# Patient Record
Sex: Female | Born: 1965 | Race: White | Hispanic: No | Marital: Married | Smoking: Former smoker
Health system: Southern US, Community
[De-identification: ages and names within clinical notes are randomized; demographics above are authoritative.]

## PROBLEM LIST (undated history)

## (undated) DIAGNOSIS — F988 Other specified behavioral and emotional disorders with onset usually occurring in childhood and adolescence: Secondary | ICD-10-CM

## (undated) DIAGNOSIS — M2550 Pain in unspecified joint: Secondary | ICD-10-CM

## (undated) DIAGNOSIS — M51379 Other intervertebral disc degeneration, lumbosacral region without mention of lumbar back pain or lower extremity pain: Secondary | ICD-10-CM

## (undated) DIAGNOSIS — G589 Mononeuropathy, unspecified: Secondary | ICD-10-CM

## (undated) DIAGNOSIS — Z973 Presence of spectacles and contact lenses: Secondary | ICD-10-CM

## (undated) DIAGNOSIS — I10 Essential (primary) hypertension: Secondary | ICD-10-CM

## (undated) DIAGNOSIS — K219 Gastro-esophageal reflux disease without esophagitis: Secondary | ICD-10-CM

## (undated) DIAGNOSIS — E8881 Metabolic syndrome: Secondary | ICD-10-CM

## (undated) DIAGNOSIS — E7800 Pure hypercholesterolemia, unspecified: Secondary | ICD-10-CM

## (undated) DIAGNOSIS — R7303 Prediabetes: Secondary | ICD-10-CM

## (undated) DIAGNOSIS — G4730 Sleep apnea, unspecified: Secondary | ICD-10-CM

## (undated) DIAGNOSIS — E88819 Insulin resistance, unspecified: Secondary | ICD-10-CM

## (undated) DIAGNOSIS — M549 Dorsalgia, unspecified: Secondary | ICD-10-CM

## (undated) DIAGNOSIS — E282 Polycystic ovarian syndrome: Secondary | ICD-10-CM

## (undated) DIAGNOSIS — T4145XA Adverse effect of unspecified anesthetic, initial encounter: Secondary | ICD-10-CM

## (undated) DIAGNOSIS — M5137 Other intervertebral disc degeneration, lumbosacral region: Secondary | ICD-10-CM

## (undated) DIAGNOSIS — Z1509 Genetic susceptibility to other malignant neoplasm: Secondary | ICD-10-CM

## (undated) DIAGNOSIS — T8859XA Other complications of anesthesia, initial encounter: Secondary | ICD-10-CM

## (undated) DIAGNOSIS — N979 Female infertility, unspecified: Secondary | ICD-10-CM

## (undated) HISTORY — DX: Sleep apnea, unspecified: G47.30

## (undated) HISTORY — DX: Insulin resistance, unspecified: E88.819

## (undated) HISTORY — DX: Genetic susceptibility to other malignant neoplasm: Z15.09

## (undated) HISTORY — DX: Female infertility, unspecified: N97.9

## (undated) HISTORY — DX: Pain in unspecified joint: M25.50

## (undated) HISTORY — DX: Prediabetes: R73.03

## (undated) HISTORY — PX: TONSILLECTOMY: SUR1361

## (undated) HISTORY — DX: Pure hypercholesterolemia, unspecified: E78.00

## (undated) HISTORY — DX: Polycystic ovarian syndrome: E28.2

## (undated) HISTORY — DX: Metabolic syndrome: E88.81

---

## 1997-10-09 ENCOUNTER — Other Ambulatory Visit: Admission: RE | Admit: 1997-10-09 | Discharge: 1997-10-09 | Payer: Self-pay | Admitting: Gynecology

## 1999-08-30 ENCOUNTER — Other Ambulatory Visit: Admission: RE | Admit: 1999-08-30 | Discharge: 1999-08-30 | Payer: Self-pay | Admitting: Gynecology

## 2000-08-23 ENCOUNTER — Other Ambulatory Visit: Admission: RE | Admit: 2000-08-23 | Discharge: 2000-08-23 | Payer: Self-pay | Admitting: Obstetrics and Gynecology

## 2000-10-23 ENCOUNTER — Encounter: Payer: Self-pay | Admitting: Obstetrics & Gynecology

## 2000-10-23 ENCOUNTER — Ambulatory Visit (HOSPITAL_COMMUNITY): Admission: RE | Admit: 2000-10-23 | Discharge: 2000-10-23 | Payer: Self-pay | Admitting: Obstetrics & Gynecology

## 2000-12-26 ENCOUNTER — Encounter: Admission: RE | Admit: 2000-12-26 | Discharge: 2001-02-07 | Payer: Self-pay | Admitting: Obstetrics & Gynecology

## 2001-03-13 ENCOUNTER — Inpatient Hospital Stay (HOSPITAL_COMMUNITY): Admission: AD | Admit: 2001-03-13 | Discharge: 2001-03-16 | Payer: Self-pay | Admitting: Obstetrics and Gynecology

## 2001-03-17 ENCOUNTER — Encounter: Admission: RE | Admit: 2001-03-17 | Discharge: 2001-04-16 | Payer: Self-pay | Admitting: Obstetrics and Gynecology

## 2001-05-17 ENCOUNTER — Encounter: Admission: RE | Admit: 2001-05-17 | Discharge: 2001-06-16 | Payer: Self-pay | Admitting: Obstetrics and Gynecology

## 2001-07-17 ENCOUNTER — Encounter: Admission: RE | Admit: 2001-07-17 | Discharge: 2001-08-16 | Payer: Self-pay | Admitting: Obstetrics and Gynecology

## 2001-08-17 ENCOUNTER — Encounter: Admission: RE | Admit: 2001-08-17 | Discharge: 2001-09-16 | Payer: Self-pay | Admitting: Obstetrics and Gynecology

## 2001-10-15 ENCOUNTER — Encounter: Admission: RE | Admit: 2001-10-15 | Discharge: 2001-11-14 | Payer: Self-pay | Admitting: Obstetrics and Gynecology

## 2001-10-18 ENCOUNTER — Other Ambulatory Visit: Admission: RE | Admit: 2001-10-18 | Discharge: 2001-10-18 | Payer: Self-pay | Admitting: Obstetrics and Gynecology

## 2002-10-29 ENCOUNTER — Other Ambulatory Visit: Admission: RE | Admit: 2002-10-29 | Discharge: 2002-10-29 | Payer: Self-pay | Admitting: Obstetrics and Gynecology

## 2002-10-31 ENCOUNTER — Encounter: Admission: RE | Admit: 2002-10-31 | Discharge: 2002-10-31 | Payer: Self-pay | Admitting: Obstetrics and Gynecology

## 2002-10-31 ENCOUNTER — Encounter: Payer: Self-pay | Admitting: Obstetrics and Gynecology

## 2003-07-31 ENCOUNTER — Encounter: Admission: RE | Admit: 2003-07-31 | Discharge: 2003-07-31 | Payer: Self-pay | Admitting: Family Medicine

## 2003-12-04 ENCOUNTER — Other Ambulatory Visit: Admission: RE | Admit: 2003-12-04 | Discharge: 2003-12-04 | Payer: Self-pay | Admitting: Obstetrics and Gynecology

## 2004-05-19 ENCOUNTER — Other Ambulatory Visit: Admission: RE | Admit: 2004-05-19 | Discharge: 2004-05-19 | Payer: Self-pay | Admitting: Obstetrics and Gynecology

## 2005-05-16 ENCOUNTER — Other Ambulatory Visit: Admission: RE | Admit: 2005-05-16 | Discharge: 2005-05-16 | Payer: Self-pay | Admitting: Obstetrics and Gynecology

## 2007-06-05 ENCOUNTER — Encounter: Admission: RE | Admit: 2007-06-05 | Discharge: 2007-06-05 | Payer: Self-pay | Admitting: Obstetrics and Gynecology

## 2008-10-22 ENCOUNTER — Encounter: Admission: RE | Admit: 2008-10-22 | Discharge: 2008-10-22 | Payer: Self-pay | Admitting: Obstetrics & Gynecology

## 2008-12-27 ENCOUNTER — Inpatient Hospital Stay (HOSPITAL_COMMUNITY): Admission: AD | Admit: 2008-12-27 | Discharge: 2008-12-29 | Payer: Self-pay | Admitting: Obstetrics & Gynecology

## 2008-12-30 ENCOUNTER — Encounter: Admission: RE | Admit: 2008-12-30 | Discharge: 2009-01-28 | Payer: Self-pay | Admitting: Obstetrics & Gynecology

## 2009-01-29 ENCOUNTER — Encounter: Admission: RE | Admit: 2009-01-29 | Discharge: 2009-02-28 | Payer: Self-pay | Admitting: Obstetrics & Gynecology

## 2009-03-01 ENCOUNTER — Encounter: Admission: RE | Admit: 2009-03-01 | Discharge: 2009-03-31 | Payer: Self-pay | Admitting: Obstetrics & Gynecology

## 2009-04-01 ENCOUNTER — Encounter: Admission: RE | Admit: 2009-04-01 | Discharge: 2009-04-30 | Payer: Self-pay | Admitting: Obstetrics & Gynecology

## 2009-05-01 ENCOUNTER — Encounter: Admission: RE | Admit: 2009-05-01 | Discharge: 2009-05-31 | Payer: Self-pay | Admitting: Obstetrics & Gynecology

## 2009-06-01 ENCOUNTER — Encounter: Admission: RE | Admit: 2009-06-01 | Discharge: 2009-06-30 | Payer: Self-pay | Admitting: Obstetrics & Gynecology

## 2009-07-01 ENCOUNTER — Encounter: Admission: RE | Admit: 2009-07-01 | Discharge: 2009-07-09 | Payer: Self-pay | Admitting: Obstetrics & Gynecology

## 2009-08-01 ENCOUNTER — Encounter: Admission: RE | Admit: 2009-08-01 | Discharge: 2009-08-31 | Payer: Self-pay | Admitting: Obstetrics & Gynecology

## 2009-09-01 ENCOUNTER — Encounter: Admission: RE | Admit: 2009-09-01 | Discharge: 2009-10-01 | Payer: Self-pay | Admitting: Obstetrics & Gynecology

## 2009-12-15 ENCOUNTER — Encounter: Admission: RE | Admit: 2009-12-15 | Discharge: 2009-12-15 | Payer: Self-pay | Admitting: Obstetrics and Gynecology

## 2010-08-01 ENCOUNTER — Encounter: Payer: Self-pay | Admitting: Obstetrics and Gynecology

## 2010-10-18 LAB — CBC
Hemoglobin: 11.5 g/dL — ABNORMAL LOW (ref 12.0–15.0)
Hemoglobin: 12.9 g/dL (ref 12.0–15.0)
MCHC: 35.8 g/dL (ref 30.0–36.0)
MCV: 96.1 fL (ref 78.0–100.0)
RBC: 3.34 MIL/uL — ABNORMAL LOW (ref 3.87–5.11)
RBC: 3.77 MIL/uL — ABNORMAL LOW (ref 3.87–5.11)
RDW: 13.2 % (ref 11.5–15.5)
WBC: 8.3 10*3/uL (ref 4.0–10.5)

## 2010-10-18 LAB — COMPREHENSIVE METABOLIC PANEL
ALT: 17 U/L (ref 0–35)
AST: 28 U/L (ref 0–37)
Alkaline Phosphatase: 115 U/L (ref 39–117)
CO2: 19 mEq/L (ref 19–32)
GFR calc Af Amer: >60 mL/min (ref >60)
GFR calc non Af Amer: >60 mL/min (ref >60)
Glucose, Bld: 106 mg/dL — ABNORMAL HIGH (ref 70–99)
Potassium: 4 mEq/L (ref 3.5–5.1)
Sodium: 136 mEq/L (ref 135–145)

## 2010-10-18 LAB — RPR: RPR Ser Ql: NONREACTIVE

## 2010-12-29 ENCOUNTER — Other Ambulatory Visit: Payer: Self-pay | Admitting: Obstetrics and Gynecology

## 2010-12-29 DIAGNOSIS — Z1231 Encounter for screening mammogram for malignant neoplasm of breast: Secondary | ICD-10-CM

## 2011-01-18 ENCOUNTER — Ambulatory Visit
Admission: RE | Admit: 2011-01-18 | Discharge: 2011-01-18 | Disposition: A | Discharge status: Home / Self Care | Payer: 59 | Source: Ambulatory Visit | Attending: Obstetrics and Gynecology | Admitting: Obstetrics and Gynecology

## 2011-01-18 DIAGNOSIS — Z1231 Encounter for screening mammogram for malignant neoplasm of breast: Secondary | ICD-10-CM

## 2011-12-20 ENCOUNTER — Other Ambulatory Visit: Payer: Self-pay | Admitting: Obstetrics and Gynecology

## 2011-12-20 DIAGNOSIS — Z1231 Encounter for screening mammogram for malignant neoplasm of breast: Secondary | ICD-10-CM

## 2012-01-19 ENCOUNTER — Ambulatory Visit: Payer: 59

## 2012-01-21 ENCOUNTER — Emergency Department (HOSPITAL_BASED_OUTPATIENT_CLINIC_OR_DEPARTMENT_OTHER)
Admission: EM | Admit: 2012-01-21 | Discharge: 2012-01-21 | Disposition: A | Discharge status: Home / Self Care | Payer: 59 | Attending: Emergency Medicine | Admitting: Emergency Medicine

## 2012-01-21 ENCOUNTER — Emergency Department (HOSPITAL_BASED_OUTPATIENT_CLINIC_OR_DEPARTMENT_OTHER): Payer: 59

## 2012-01-21 ENCOUNTER — Encounter (HOSPITAL_BASED_OUTPATIENT_CLINIC_OR_DEPARTMENT_OTHER): Payer: Self-pay | Admitting: *Deleted

## 2012-01-21 DIAGNOSIS — M25579 Pain in unspecified ankle and joints of unspecified foot: Secondary | ICD-10-CM | POA: Insufficient documentation

## 2012-01-21 DIAGNOSIS — S93409A Sprain of unspecified ligament of unspecified ankle, initial encounter: Secondary | ICD-10-CM

## 2012-01-21 DIAGNOSIS — X58XXXA Exposure to other specified factors, initial encounter: Secondary | ICD-10-CM | POA: Insufficient documentation

## 2012-01-21 DIAGNOSIS — Z79899 Other long term (current) drug therapy: Secondary | ICD-10-CM | POA: Insufficient documentation

## 2012-01-21 DIAGNOSIS — I10 Essential (primary) hypertension: Secondary | ICD-10-CM | POA: Insufficient documentation

## 2012-01-21 DIAGNOSIS — M25476 Effusion, unspecified foot: Secondary | ICD-10-CM | POA: Insufficient documentation

## 2012-01-21 DIAGNOSIS — M25473 Effusion, unspecified ankle: Secondary | ICD-10-CM | POA: Insufficient documentation

## 2012-01-21 HISTORY — DX: Essential (primary) hypertension: I10

## 2012-01-21 NOTE — ED Notes (Addendum)
Pt states she injured her right ankle about an hour ago. Ice applied at triage. Pt in W/C.

## 2012-01-21 NOTE — ED Provider Notes (Signed)
Medical screening examination/treatment/procedure(s) were performed by non-physician practitioner and as supervising physician I was immediately available for consultation/collaboration.  Gerhard Munch, MD 01/21/12 2249

## 2012-01-21 NOTE — ED Provider Notes (Signed)
History     CSN: [id number]  Arrival date & time 01/21/12  1624   First MD Initiated Contact with Patient 01/21/12 1818      Chief Complaint  Patient presents with  . Ankle Injury    (Consider location/radiation/quality/duration/timing/severity/associated sxs/prior treatment) Patient is a 46 y.o. female presenting with lower extremity injury. The history is provided by the patient.  Ankle Injury This is a new problem. The problem occurs constantly. The problem has been gradually worsening. Associated symptoms include arthralgias. Pertinent negatives include no abdominal pain, chest pain, coughing, nausea, neck pain or vomiting. The symptoms are aggravated by standing and walking. She has tried NSAIDs for the symptoms. The treatment provided mild relief.    Past Medical History  Diagnosis Date  . Hypertension     History reviewed. No pertinent past surgical history.  No family history on file.  History  Substance Use Topics  . Smoking status: Never Smoker   . Smokeless tobacco: Not on file  . Alcohol Use: Yes    OB History    Grav Para Term Preterm Abortions TAB SAB Ect Mult Living                  Review of Systems  Constitutional: Negative for activity change.       All ROS Neg except as noted in HPI  HENT: Negative for nosebleeds and neck pain.   Eyes: Negative for photophobia and discharge.  Respiratory: Negative for cough, shortness of breath and wheezing.   Cardiovascular: Negative for chest pain and palpitations.  Gastrointestinal: Negative for nausea, vomiting, abdominal pain and blood in stool.  Genitourinary: Negative for dysuria, frequency and hematuria.  Musculoskeletal: Positive for arthralgias. Negative for back pain.  Skin: Negative.   Neurological: Negative for dizziness, seizures and speech difficulty.  Psychiatric/Behavioral: Negative for hallucinations and confusion.    Allergies  Review of patient's allergies indicates no known  allergies.  Home Medications   Current Outpatient Rx  Name Route Sig Dispense Refill  . DROSPIRENONE-ETHINYL ESTRADIOL 3-0.02 MG PO TABS Oral Take 1 tablet by mouth daily.    . METHYLDOPA PO Oral Take 1 tablet by mouth daily.    [name] Kitchen NAPROXEN SODIUM 220 MG PO TABS Oral Take 440 mg by mouth daily as needed. For pain    . NIFEDIPINE PO Oral Take 1 tablet by mouth daily.      BP 128/80  Pulse 76  Temp 98.5 F (36.9 C) (Oral)  Resp 20  Ht 5\' 4"  (1.626 m)  Wt 190 lb (86.183 kg)  BMI 32.61 kg/m2  SpO2 100%  LMP 12/31/2011  Physical Exam  Nursing note and vitals reviewed. Constitutional: She is oriented to person, place, and time. She appears well-developed and well-nourished.  Non-toxic appearance.  HENT:  Head: Normocephalic.  Right Ear: Tympanic membrane and external ear normal.  Left Ear: Tympanic membrane and external ear normal.  Eyes: EOM and lids are normal. Pupils are equal, round, and reactive to light.  Neck: Normal range of motion. Neck supple. Carotid bruit is not present.  Cardiovascular: Normal rate, regular rhythm, normal heart sounds, intact distal pulses and normal pulses.   Pulmonary/Chest: Breath sounds normal. No respiratory distress.  Abdominal: Soft. Bowel sounds are normal. There is no tenderness. There is no guarding.  Musculoskeletal: Normal range of motion.       There is swelling and tenderness of the lateral malleolus on the right. There is mild soreness of the medial malleolus. There is good  capillary refill. Pulses are symmetrical. The Achilles tendon on the right is intact. There is full range of motion of the knees and hips.  Lymphadenopathy:       Head (right side): No submandibular adenopathy present.       Head (left side): No submandibular adenopathy present.    She has no cervical adenopathy.  Neurological: She is alert and oriented to person, place, and time. She has normal strength. No cranial nerve deficit or sensory deficit.  Skin: Skin is  warm and dry.  Psychiatric: She has a normal mood and affect. Her speech is normal.    ED Course  Procedures (including critical care time)  Labs Reviewed - No data to display Dg Ankle Complete Right  01/21/2012  *RADIOLOGY REPORT*  Clinical Data: Ankle injury and pain.  RIGHT ANKLE - COMPLETE 3+ VIEW  Comparison: None.  Findings: No evidence of fracture or dislocation.  No evidence of ankle joint effusion. An accessory ossicle or old avulsion fracture fragment is seen along the inferior aspect of the medial malleolus. No evidence of ankle joint arthropathy or other significant bone abnormality.  IMPRESSION: No acute findings.  Original Report Authenticated By: Danae Orleans, M.D.     1. Ankle sprain       MDM  I have reviewed nursing notes, vital signs, and all appropriate lab and imaging results for this patient. Patient states that she changed shoes and while walking she twisted the right ankle. She did not sustain a fall. The patient noted swelling and pain and presented to the emergency department for evaluation of fracture. The x-ray of the right ankle is negative for fracture or dislocation. The patient is fitted with an ankle stirrup splint. Crutches were offered but the patient declined. The patient requests to use naproxen for her discomfort. She is advised to apply ice and to elevate the ankle. She is given orthopedic referral for evaluation if not improving. It is safe for the patient to go home at this time.       Kathie Dike, [place] 01/21/12 1906

## 2012-02-28 ENCOUNTER — Ambulatory Visit
Admission: RE | Admit: 2012-02-28 | Discharge: 2012-02-28 | Disposition: A | Discharge status: Home / Self Care | Payer: 59 | Source: Ambulatory Visit | Attending: Obstetrics and Gynecology | Admitting: Obstetrics and Gynecology

## 2012-02-28 DIAGNOSIS — Z1231 Encounter for screening mammogram for malignant neoplasm of breast: Secondary | ICD-10-CM

## 2013-02-15 ENCOUNTER — Other Ambulatory Visit: Payer: Self-pay

## 2013-02-15 DIAGNOSIS — Z1231 Encounter for screening mammogram for malignant neoplasm of breast: Secondary | ICD-10-CM

## 2013-03-01 ENCOUNTER — Ambulatory Visit
Admission: RE | Admit: 2013-03-01 | Discharge: 2013-03-01 | Disposition: A | Discharge status: Home / Self Care | Payer: 59 | Source: Ambulatory Visit

## 2013-03-01 DIAGNOSIS — Z1231 Encounter for screening mammogram for malignant neoplasm of breast: Secondary | ICD-10-CM

## 2014-02-28 ENCOUNTER — Other Ambulatory Visit: Payer: Self-pay

## 2014-02-28 DIAGNOSIS — Z1231 Encounter for screening mammogram for malignant neoplasm of breast: Secondary | ICD-10-CM

## 2014-03-07 ENCOUNTER — Ambulatory Visit: Payer: 59

## 2014-03-14 ENCOUNTER — Ambulatory Visit
Admission: RE | Admit: 2014-03-14 | Discharge: 2014-03-14 | Disposition: A | Discharge status: Home / Self Care | Payer: 59 | Source: Ambulatory Visit

## 2014-03-14 DIAGNOSIS — Z1231 Encounter for screening mammogram for malignant neoplasm of breast: Secondary | ICD-10-CM

## 2014-07-31 ENCOUNTER — Other Ambulatory Visit: Payer: Self-pay | Admitting: Orthopedic Surgery

## 2014-08-12 ENCOUNTER — Encounter (HOSPITAL_BASED_OUTPATIENT_CLINIC_OR_DEPARTMENT_OTHER): Payer: Self-pay | Admitting: *Deleted

## 2014-08-12 NOTE — Progress Notes (Signed)
To come in for ekg-bmet 

## 2014-08-13 ENCOUNTER — Ambulatory Visit (HOSPITAL_BASED_OUTPATIENT_CLINIC_OR_DEPARTMENT_OTHER)
Admission: RE | Admit: 2014-08-13 | Discharge: 2014-08-13 | Disposition: A | Discharge status: Home / Self Care | Payer: 59 | Source: Ambulatory Visit | Attending: Orthopedic Surgery | Admitting: Orthopedic Surgery

## 2014-08-13 DIAGNOSIS — Z87891 Personal history of nicotine dependence: Secondary | ICD-10-CM | POA: Diagnosis not present

## 2014-08-13 DIAGNOSIS — I10 Essential (primary) hypertension: Secondary | ICD-10-CM | POA: Diagnosis not present

## 2014-08-13 DIAGNOSIS — Z01810 Encounter for preprocedural cardiovascular examination: Secondary | ICD-10-CM | POA: Insufficient documentation

## 2014-08-13 DIAGNOSIS — G5600 Carpal tunnel syndrome, unspecified upper limb: Secondary | ICD-10-CM | POA: Diagnosis not present

## 2014-08-13 DIAGNOSIS — R000 Tachycardia, unspecified: Secondary | ICD-10-CM | POA: Diagnosis not present

## 2014-08-13 DIAGNOSIS — G5602 Carpal tunnel syndrome, left upper limb: Secondary | ICD-10-CM | POA: Diagnosis not present

## 2014-08-13 DIAGNOSIS — Z79899 Other long term (current) drug therapy: Secondary | ICD-10-CM | POA: Diagnosis not present

## 2014-08-13 LAB — BASIC METABOLIC PANEL
Anion gap: 9 (ref 5–15)
BUN: 16 mg/dL (ref 6–23)
CALCIUM: 9.2 mg/dL (ref 8.4–10.5)
CO2: 24 mmol/L (ref 19–32)
Chloride: 104 mmol/L (ref 96–112)
Creatinine, Ser: 0.74 mg/dL (ref 0.50–1.10)
GFR calc Af Amer: >90 mL/min (ref >90)
GLUCOSE: 107 mg/dL — AB (ref 70–99)
Potassium: 4.3 mmol/L (ref 3.5–5.1)
SODIUM: 137 mmol/L (ref 135–145)

## 2014-08-14 ENCOUNTER — Encounter (HOSPITAL_BASED_OUTPATIENT_CLINIC_OR_DEPARTMENT_OTHER): Payer: Self-pay | Admitting: Orthopedic Surgery

## 2014-08-14 ENCOUNTER — Ambulatory Visit (HOSPITAL_BASED_OUTPATIENT_CLINIC_OR_DEPARTMENT_OTHER): Payer: 59 | Admitting: Certified Registered"

## 2014-08-14 ENCOUNTER — Encounter (HOSPITAL_BASED_OUTPATIENT_CLINIC_OR_DEPARTMENT_OTHER)
Admission: RE | Disposition: A | Discharge status: Home / Self Care | Payer: Self-pay | Source: Ambulatory Visit | Attending: Orthopedic Surgery

## 2014-08-14 ENCOUNTER — Ambulatory Visit (HOSPITAL_BASED_OUTPATIENT_CLINIC_OR_DEPARTMENT_OTHER)
Admission: RE | Admit: 2014-08-14 | Discharge: 2014-08-14 | Disposition: A | Discharge status: Home / Self Care | Payer: 59 | Source: Ambulatory Visit | Attending: Orthopedic Surgery | Admitting: Orthopedic Surgery

## 2014-08-14 DIAGNOSIS — Z87891 Personal history of nicotine dependence: Secondary | ICD-10-CM | POA: Insufficient documentation

## 2014-08-14 DIAGNOSIS — G5602 Carpal tunnel syndrome, left upper limb: Secondary | ICD-10-CM | POA: Insufficient documentation

## 2014-08-14 DIAGNOSIS — R000 Tachycardia, unspecified: Secondary | ICD-10-CM | POA: Insufficient documentation

## 2014-08-14 DIAGNOSIS — Z79899 Other long term (current) drug therapy: Secondary | ICD-10-CM | POA: Insufficient documentation

## 2014-08-14 DIAGNOSIS — I10 Essential (primary) hypertension: Secondary | ICD-10-CM | POA: Insufficient documentation

## 2014-08-14 HISTORY — DX: Other complications of anesthesia, initial encounter: T88.59XA

## 2014-08-14 HISTORY — PX: CARPAL TUNNEL RELEASE: SHX101

## 2014-08-14 HISTORY — DX: Presence of spectacles and contact lenses: Z97.3

## 2014-08-14 HISTORY — DX: Adverse effect of unspecified anesthetic, initial encounter: T41.45XA

## 2014-08-14 HISTORY — DX: Other specified behavioral and emotional disorders with onset usually occurring in childhood and adolescence: F98.8

## 2014-08-14 LAB — POCT HEMOGLOBIN-HEMACUE: Hemoglobin: 14.7 g/dL (ref 12.0–15.0)

## 2014-08-14 SURGERY — CARPAL TUNNEL RELEASE
Anesthesia: Monitor Anesthesia Care | Site: Wrist | Laterality: Left

## 2014-08-14 MED ORDER — OXYCODONE HCL 5 MG PO TABS
ORAL_TABLET | ORAL | Status: AC
  Filled 2014-08-14: qty 1

## 2014-08-14 MED ORDER — MIDAZOLAM HCL 2 MG/2ML IJ SOLN
INTRAMUSCULAR | Status: AC
  Filled 2014-08-14: qty 2

## 2014-08-14 MED ORDER — CEFAZOLIN SODIUM-DEXTROSE 2-3 GM-% IV SOLR: 2.0000 g | INTRAVENOUS | Status: DC

## 2014-08-14 MED ORDER — OXYCODONE HCL 5 MG PO TABS
5.0000 mg | ORAL_TABLET | Freq: Once | ORAL | Status: AC | PRN
  Administered 2014-08-14: 5 mg via ORAL

## 2014-08-14 MED ORDER — LIDOCAINE HCL (PF) 0.5 % IJ SOLN
INTRAMUSCULAR | Status: DC | PRN
  Administered 2014-08-14: 30 mL via INTRAVENOUS

## 2014-08-14 MED ORDER — BUPIVACAINE HCL (PF) 0.25 % IJ SOLN
INTRAMUSCULAR | Status: DC | PRN
  Administered 2014-08-14: 8 mL

## 2014-08-14 MED ORDER — CHLORHEXIDINE GLUCONATE 4 % EX LIQD
60.0000 mL | Freq: Once | CUTANEOUS | Status: DC
Start: 2014-08-14 — End: 2014-08-14

## 2014-08-14 MED ORDER — LIDOCAINE HCL (CARDIAC) 20 MG/ML IV SOLN
INTRAVENOUS | Status: DC | PRN
  Administered 2014-08-14: 30 mg via INTRAVENOUS

## 2014-08-14 MED ORDER — MIDAZOLAM HCL 2 MG/2ML IJ SOLN: 1.0000 mg | INTRAMUSCULAR | Status: DC | PRN

## 2014-08-14 MED ORDER — FENTANYL CITRATE 0.05 MG/ML IJ SOLN: 50.0000 ug | INTRAMUSCULAR | Status: DC | PRN

## 2014-08-14 MED ORDER — OXYCODONE HCL 5 MG/5ML PO SOLN: 5.0000 mg | Freq: Once | ORAL | Status: AC | PRN

## 2014-08-14 MED ORDER — LACTATED RINGERS IV SOLN
INTRAVENOUS | Status: DC
  Administered 2014-08-14: 11:00:00 via INTRAVENOUS

## 2014-08-14 MED ORDER — ONDANSETRON HCL 4 MG/2ML IJ SOLN
INTRAMUSCULAR | Status: DC | PRN
  Administered 2014-08-14: 4 mg via INTRAVENOUS

## 2014-08-14 MED ORDER — HYDROMORPHONE HCL 1 MG/ML IJ SOLN: 0.2500 mg | INTRAMUSCULAR | Status: DC | PRN

## 2014-08-14 MED ORDER — MIDAZOLAM HCL 5 MG/5ML IJ SOLN
INTRAMUSCULAR | Status: DC | PRN
  Administered 2014-08-14: 2 mg via INTRAVENOUS

## 2014-08-14 MED ORDER — FENTANYL CITRATE 0.05 MG/ML IJ SOLN
INTRAMUSCULAR | Status: DC | PRN
  Administered 2014-08-14: 50 ug via INTRAVENOUS
  Administered 2014-08-14 (×2): 25 ug via INTRAVENOUS

## 2014-08-14 MED ORDER — CHLORHEXIDINE GLUCONATE 4 % EX LIQD: 60.0000 mL | Freq: Once | CUTANEOUS | Status: DC

## 2014-08-14 MED ORDER — FENTANYL CITRATE 0.05 MG/ML IJ SOLN
INTRAMUSCULAR | Status: AC
  Filled 2014-08-14: qty 2

## 2014-08-14 MED ORDER — PROPOFOL INFUSION 10 MG/ML OPTIME
INTRAVENOUS | Status: DC | PRN
  Administered 2014-08-14: 75 ug/kg/min via INTRAVENOUS

## 2014-08-14 MED ORDER — HYDROCODONE-ACETAMINOPHEN 5-325 MG PO TABS: 1.0000 | ORAL_TABLET | Freq: Four times a day (QID) | ORAL | Status: DC | PRN

## 2014-08-14 MED ORDER — ONDANSETRON HCL 4 MG/2ML IJ SOLN: 4.0000 mg | Freq: Once | INTRAMUSCULAR | Status: DC | PRN

## 2014-08-14 MED ORDER — CEFAZOLIN SODIUM-DEXTROSE 2-3 GM-% IV SOLR
2.0000 g | INTRAVENOUS | Status: AC
  Administered 2014-08-14: 2 g via INTRAVENOUS

## 2014-08-14 SURGICAL SUPPLY — 35 items
BLADE SURG 15 STRL LF DISP TIS (BLADE) ×1 IMPLANT
BLADE SURG 15 STRL SS (BLADE) ×1
BNDG COHESIVE 3X5 TAN STRL LF (GAUZE/BANDAGES/DRESSINGS) ×2 IMPLANT
BNDG ESMARK 4X9 LF (GAUZE/BANDAGES/DRESSINGS) IMPLANT
BNDG GAUZE ELAST 4 BULKY (GAUZE/BANDAGES/DRESSINGS) ×2 IMPLANT
CHLORAPREP W/TINT 26ML (MISCELLANEOUS) ×2 IMPLANT
CORDS BIPOLAR (ELECTRODE) ×2 IMPLANT
COVER BACK TABLE 60X90IN (DRAPES) ×2 IMPLANT
COVER MAYO STAND STRL (DRAPES) ×2 IMPLANT
CUFF TOURNIQUET SINGLE 18IN (TOURNIQUET CUFF) ×2 IMPLANT
DRAPE EXTREMITY T 121X128X90 (DRAPE) ×2 IMPLANT
DRAPE SURG 17X23 STRL (DRAPES) ×2 IMPLANT
DRSG PAD ABDOMINAL 8X10 ST (GAUZE/BANDAGES/DRESSINGS) ×2 IMPLANT
GAUZE SPONGE 4X4 12PLY STRL (GAUZE/BANDAGES/DRESSINGS) ×2 IMPLANT
GAUZE XEROFORM 1X8 LF (GAUZE/BANDAGES/DRESSINGS) ×2 IMPLANT
GLOVE BIOGEL PI IND STRL 7.0 (GLOVE) ×1 IMPLANT
GLOVE BIOGEL PI IND STRL 8.5 (GLOVE) ×1 IMPLANT
GLOVE BIOGEL PI INDICATOR 7.0 (GLOVE) ×1
GLOVE BIOGEL PI INDICATOR 8.5 (GLOVE) ×1
GLOVE ECLIPSE 6.5 STRL STRAW (GLOVE) ×2 IMPLANT
GLOVE EXAM NITRILE MD LF STRL (GLOVE) ×2 IMPLANT
GLOVE SURG ORTHO 8.0 STRL STRW (GLOVE) ×2 IMPLANT
GOWN STRL REUS W/ TWL LRG LVL3 (GOWN DISPOSABLE) ×1 IMPLANT
GOWN STRL REUS W/TWL LRG LVL3 (GOWN DISPOSABLE) ×1
GOWN STRL REUS W/TWL XL LVL3 (GOWN DISPOSABLE) ×2 IMPLANT
NEEDLE PRECISIONGLIDE 27X1.5 (NEEDLE) ×2 IMPLANT
NS IRRIG 1000ML POUR BTL (IV SOLUTION) ×2 IMPLANT
PACK BASIN DAY SURGERY FS (CUSTOM PROCEDURE TRAY) ×2 IMPLANT
STOCKINETTE 4X48 STRL (DRAPES) ×2 IMPLANT
SUT ETHILON 4 0 PS 2 18 (SUTURE) ×2 IMPLANT
SUT VICRYL 4-0 PS2 18IN ABS (SUTURE) IMPLANT
SYR BULB 3OZ (MISCELLANEOUS) ×2 IMPLANT
SYR CONTROL 10ML LL (SYRINGE) ×2 IMPLANT
TOWEL OR 17X24 6PK STRL BLUE (TOWEL DISPOSABLE) ×2 IMPLANT
UNDERPAD 30X30 INCONTINENT (UNDERPADS AND DIAPERS) ×2 IMPLANT

## 2014-08-14 NOTE — Op Note (Signed)
Dictation Number [id number]

## 2014-08-14 NOTE — Op Note (Signed)
[name], Gina Norman                ACCOUNT NO.:  [id number]  MEDICAL RECORD NO.:  [id number]  LOCATION:                                 FACILITY:  PHYSICIAN:  Cindee SaltGary Brannen Koppen, M.D.            DATE OF BIRTH:  DATE OF PROCEDURE:  08/14/2014 DATE OF DISCHARGE:                              OPERATIVE REPORT   PREOPERATIVE NOTE:  Carpal tunnel syndrome, left hand.  POSTOPERATIVE DIAGNOSIS:  Carpal tunnel syndrome, left hand.  OPERATION:  Decompression left median nerve.  SURGEON:  Cindee SaltGary Meshelle Holness, M.D.  ANESTHESIA:  Forearm IV regional with local infiltration.  ANESTHESIOLOGIST:  Sheldon Silvanavid Crews, M.D.  HISTORY:  The patient is a 49 year old female with a history of carpal tunnel syndrome, nerve conduction is positive.  She is admitted now for decompression to the left median nerve at the wrist.  Pre, peri, and postoperative course have been discussed along with risks and complications.  She is aware that there is no guarantee with the surgery; possibility of infection; recurrence of injury to arteries, nerves, tendons; incomplete relief of symptoms; dystrophy.  In the preoperative area, the patient was seen, the extremity marked by both patient and surgeon, antibiotic given.  PROCEDURE IN DETAIL:  The patient was brought to the operating room where forearm-based IV regional anesthetic was carried out without difficulty under the direction of Dr. Ivin Bootyrews.  She was prepped using ChloraPrep, supine position, left arm free.  A 3-minute dry time was allowed.  Time-out taken, confirming the patient and procedure.  A longitudinal incision was made in the left palm, carried down through subcutaneous tissue.  She had some feeling, the area was then injected with local infiltration of 0.25% bupivacaine without epinephrine, approximately 7 mL was used.  The palmar fascia was split.  Superficial palmar arch identified.  The flexor tendon to the ring and little finger identified to the ulnar side of median  nerve and carpal retinaculum was incised with sharp dissection.  Right angle and Sewall retractor were placed between skin and forearm fascia.  The fascia was released for approximately 2 cm proximal to the wrist crease under direct vision. Canal was explored.  Air compression to the nerve was apparent.  Motor branch entered muscle distally.  The wound was copiously irrigated with saline and the skin closed with interrupted 4-0 nylon sutures.  A sterile compressive dressing with the fingers free was applied. On deflation of the tourniquet, all fingers immediately pinked.  She was taken to the recovery room for observation in satisfactory condition. She will be discharged home [facility], on [facility].          ______________________________ Cindee SaltGary Ariatna Jester, M.D.     GK/MEDQ  D:  08/14/2014  T:  08/14/2014  Job:  [id number]

## 2014-08-14 NOTE — Anesthesia Procedure Notes (Addendum)
Procedure Name: MAC Date/Time: 08/14/2014 12:04 PM Performed by: Calin Ellery Pre-anesthesia Checklist: Patient identified, Emergency Drugs available, Suction available and Patient being monitored Patient Re-evaluated:Patient Re-evaluated prior to inductionOxygen Delivery Method: Simple face mask   Anesthesia Regional Block:  Bier block (IV Regional)  Pre-Anesthetic Checklist: ,, timeout performed, Correct Patient, Correct Site, Correct Laterality, Correct Procedure,, site marked, surgical consent,, at surgeon's request Needles:  Injection technique: Single-shot  Needle Type: Other      Needle Gauge: 20 and 20 G    Additional Needles: Bier block (IV Regional) Narrative:   Performed by: Personally

## 2014-08-14 NOTE — Anesthesia Preprocedure Evaluation (Addendum)
Anesthesia Evaluation  Patient identified by MRN, date of birth, ID band Patient awake    Reviewed: Allergy & Precautions, NPO status , Patient's Chart, lab work & pertinent test results  Airway Mallampati: I  TM Distance: >3 FB Neck ROM: Full    Dental  (+) Teeth Intact, Dental Advisory Given   Pulmonary former smoker,  breath sounds clear to auscultation        Cardiovascular hypertension, Pt. on medications Rhythm:Regular Rate:Normal     Neuro/Psych    GI/Hepatic   Endo/Other    Renal/GU      Musculoskeletal   Abdominal   Peds  Hematology   Anesthesia Other Findings   Reproductive/Obstetrics                            Anesthesia Physical Anesthesia Plan  ASA: II  Anesthesia Plan: MAC and Bier Block   Post-op Pain Management:    Induction: Intravenous  Airway Management Planned: Simple Face Mask  Additional Equipment:   Intra-op Plan:   Post-operative Plan:   Informed Consent: I have reviewed the patients History and Physical, chart, labs and discussed the procedure including the risks, benefits and alternatives for the proposed anesthesia with the patient or authorized representative who has indicated his/her understanding and acceptance.   Dental advisory given  Plan Discussed with: CRNA, Anesthesiologist and Surgeon  Anesthesia Plan Comments:         Anesthesia Quick Evaluation

## 2014-08-14 NOTE — Transfer of Care (Signed)
Immediate Anesthesia Transfer of Care Note  Patient: Gina BeaversSherri L Cegielski  Procedure(s) Performed: Procedure(s) with comments: LEFT CARPAL TUNNEL RELEASE (Left) - IV REGIONAL FAB  Patient Location: PACU  Anesthesia Type:MAC and Bier block  Level of Consciousness: awake and alert   Airway & Oxygen Therapy: Patient Spontanous Breathing and Patient connected to face mask oxygen  Post-op Assessment: Report given to RN and Post -op Vital signs reviewed and stable  Post vital signs: Reviewed and stable  Last Vitals:  Filed Vitals:   08/14/14 1019  BP: 135/87  Pulse: 109  Temp: 36.8 C  Resp: 20    Complications: No apparent anesthesia complications

## 2014-08-14 NOTE — Brief Op Note (Signed)
08/14/2014  12:26 PM  PATIENT:  Gina Norman  49 y.o. female  PRE-OPERATIVE DIAGNOSIS:  LEFT CARPAL TUNNEL SYNDROME  POST-OPERATIVE DIAGNOSIS:  LEFT CARPAL TUNNEL SYNDROME  PROCEDURE:  Procedure(s) with comments: LEFT CARPAL TUNNEL RELEASE (Left) - IV REGIONAL FAB  SURGEON:  Surgeon(s) and Role:    * Cindee SaltGary Merita Hawks, MD - Primary  PHYSICIAN ASSISTANT:   ASSISTANTS: none   ANESTHESIA:   local and regional  EBL:  Total I/O In: 600 [I.V.:600] Out: -   BLOOD ADMINISTERED:none  DRAINS: none   LOCAL MEDICATIONS USED:  BUPIVICAINE   SPECIMEN:  No Specimen  DISPOSITION OF SPECIMEN:  N/A  COUNTS:  YES  TOURNIQUET:   Total Tourniquet Time Documented: Forearm (Left) - 17 minutes Total: Forearm (Left) - 17 minutes   DICTATION: .Other Dictation: Dictation Number [id number]  PLAN OF CARE: Discharge to home after PACU  PATIENT DISPOSITION:  PACU - hemodynamically stable.

## 2014-08-14 NOTE — Discharge Instructions (Addendum)

## 2014-08-14 NOTE — H&P (Signed)
Gina Norman is a 49 year old left hand dominant female referred by Dr. Donnie Coffin for a consultation with respect to bilateral numbness and tingling on her left side on a constant basis and on her right side less frequently. This awakens her 3 out of 7 nights. She has no history of injury to the hand or neck. Her left side has been going on for approximately 8 years. She has been wearing a brace and taking ibuprofen which has not given her any relief. States rest has helped but does not always allow her to sleep at night. She has no history of diabetes or thyroid problems. She does have a history of arthritis. She has no history of gout. There is a family history of diabetes and she has been tested. She had symptoms while she was pregnant. She is complaining of some discomfort primarily on her right side, numbness and tingling on her left side is constant. She complains of intermittent, moderate, sharp, aching type pain with a feeling of weakness. She states it is getting worse. Activity makes it worse and rest makes it better. She has not had nerve conductions done at the present time. She has recently graduated from school in Conservation officer, nature.   PAST MEDICAL HISTORY:  She is allergic to E-Mycin. She is on Nifedipen, Lisinopril, she has had birth control, Singulair, Zyrtec, and probiotics. She has had a tonsillectomy.  FAMILY MEDICAL HISTORY: Positive for diabetes, high BP and arthritis.  SOCIAL HISTORY:  She does not smoke. She drinks socially. She is married.  REVIEW OF SYSTEMS: Positive for glasses, contacts, high BP, otherwise negative 14 points.  Gina Norman is an 49 y.o. female.   Chief Complaint: CTS left HPI: see above  Past Medical History  Diagnosis Date  . Hypertension   . Wears contact lenses   . ADD (attention deficit disorder)   . Complication of anesthesia     hard to wake up    Past Surgical History  Procedure Laterality Date  . Tonsillectomy      History reviewed. No  pertinent family history. Social History:  reports that she quit smoking about 6 years ago. She does not have any smokeless tobacco history on file. She reports that she drinks alcohol. She reports that she does not use illicit drugs.  Allergies:  Allergies  Allergen Reactions  . Erythromycin Nausea Only    No prescriptions prior to admission    Results for orders placed or performed during the hospital encounter of 08/14/14 (from the past 48 hour(s))  Basic metabolic panel     Status: Abnormal   Collection Time: 08/13/14 10:00 AM  Result Value Ref Range   Sodium 137 135 - 145 mmol/L   Potassium 4.3 3.5 - 5.1 mmol/L   Chloride 104 96 - 112 mmol/L   CO2 24 19 - 32 mmol/L   Glucose, Bld 107 (H) 70 - 99 mg/dL   BUN 16 6 - 23 mg/dL   Creatinine, Ser 0.74 0.50 - 1.10 mg/dL   Calcium 9.2 8.4 - 10.5 mg/dL   GFR calc non Af Amer >90 >90 mL/min   GFR calc Af Amer >90 >90 mL/min    Comment: (NOTE) The eGFR has been calculated using the CKD EPI equation. This calculation has not been validated in all clinical situations. eGFR's persistently <90 mL/min signify possible Chronic Kidney Disease.    Anion gap 9 5 - 15    No results found.   Pertinent items are noted in HPI.  Height _0  (1.626 m), weight 90.719 kg (200 lb), last menstrual period 07/30/2014.  General appearance: alert, cooperative and appears stated age Head: Normocephalic, without obvious abnormality Neck: no JVD Resp: clear to auscultation bilaterally Cardio: regular rate and rhythm, S1, S2 normal, no murmur, click, rub or gallop GI: soft, non-tender; bowel sounds normal; no masses,  no organomegaly Extremities: numbness left hand Pulses: 2+ and symmetric Skin: Skin color, texture, turgor normal. No rashes or lesions Neurologic: Grossly normal Incision/Wound: na  Assessment/Plan Nerve conductions are done revealing a motor delay of 9.3 on the left, 5.1 on the right, sensory delay of 4.3 on the left and 2.9  on the right. She has an amplitude diminution to 9 on her left and 29 on her right.  DIAGNOSIS: Bilateral carpal tunnel syndrome  She would like to proceed. Questions are encouraged and answered to the patient's satisfaction. We have discussed doing this bilaterally and she does not want to have bilateral done. She is scheduled for left carpal tunnel release as an outpatient under regional anesthesia at her discretion.  Shireen Rayburn R 08/14/2014, 9:07 AM

## 2014-08-14 NOTE — Anesthesia Postprocedure Evaluation (Signed)
  Anesthesia Post-op Note  Patient: Gina Norman  Procedure(s) Performed: Procedure(s) with comments: LEFT CARPAL TUNNEL RELEASE (Left) - IV REGIONAL FAB  Patient Location: PACU  Anesthesia Type: MAC, Bier Block   Level of Consciousness: awake, alert  and oriented  Airway and Oxygen Therapy: Patient Spontanous Breathing  Post-op Pain: mild  Post-op Assessment: Post-op Vital signs reviewed  Post-op Vital Signs: Reviewed  Last Vitals:  Filed Vitals:   08/14/14 1336  BP: 118/69  Pulse: 73  Temp: 36.6 C  Resp: 16    Complications: No apparent anesthesia complications

## 2014-08-15 ENCOUNTER — Encounter (HOSPITAL_BASED_OUTPATIENT_CLINIC_OR_DEPARTMENT_OTHER): Payer: Self-pay | Admitting: Orthopedic Surgery

## 2014-09-17 ENCOUNTER — Encounter (HOSPITAL_BASED_OUTPATIENT_CLINIC_OR_DEPARTMENT_OTHER): Payer: Self-pay | Admitting: *Deleted

## 2014-09-17 NOTE — Progress Notes (Signed)
Had lt ctr-2/16-will need istat

## 2014-09-18 ENCOUNTER — Other Ambulatory Visit: Payer: Self-pay | Admitting: Orthopedic Surgery

## 2014-09-23 ENCOUNTER — Ambulatory Visit (HOSPITAL_BASED_OUTPATIENT_CLINIC_OR_DEPARTMENT_OTHER): Payer: 59 | Admitting: Anesthesiology

## 2014-09-23 ENCOUNTER — Encounter (HOSPITAL_BASED_OUTPATIENT_CLINIC_OR_DEPARTMENT_OTHER): Payer: Self-pay | Admitting: Orthopedic Surgery

## 2014-09-23 ENCOUNTER — Ambulatory Visit (HOSPITAL_BASED_OUTPATIENT_CLINIC_OR_DEPARTMENT_OTHER)
Admission: RE | Admit: 2014-09-23 | Discharge: 2014-09-23 | Disposition: A | Discharge status: Home / Self Care | Payer: 59 | Source: Ambulatory Visit | Attending: Orthopedic Surgery | Admitting: Orthopedic Surgery

## 2014-09-23 ENCOUNTER — Encounter (HOSPITAL_BASED_OUTPATIENT_CLINIC_OR_DEPARTMENT_OTHER)
Admission: RE | Disposition: A | Discharge status: Home / Self Care | Payer: Self-pay | Source: Ambulatory Visit | Attending: Orthopedic Surgery

## 2014-09-23 DIAGNOSIS — M1990 Unspecified osteoarthritis, unspecified site: Secondary | ICD-10-CM | POA: Insufficient documentation

## 2014-09-23 DIAGNOSIS — E119 Type 2 diabetes mellitus without complications: Secondary | ICD-10-CM | POA: Insufficient documentation

## 2014-09-23 DIAGNOSIS — Z881 Allergy status to other antibiotic agents status: Secondary | ICD-10-CM | POA: Diagnosis not present

## 2014-09-23 DIAGNOSIS — G5601 Carpal tunnel syndrome, right upper limb: Secondary | ICD-10-CM | POA: Insufficient documentation

## 2014-09-23 DIAGNOSIS — I10 Essential (primary) hypertension: Secondary | ICD-10-CM | POA: Insufficient documentation

## 2014-09-23 DIAGNOSIS — Z87891 Personal history of nicotine dependence: Secondary | ICD-10-CM | POA: Diagnosis not present

## 2014-09-23 HISTORY — PX: CARPAL TUNNEL RELEASE: SHX101

## 2014-09-23 SURGERY — CARPAL TUNNEL RELEASE
Anesthesia: Monitor Anesthesia Care | Site: Wrist | Laterality: Right

## 2014-09-23 MED ORDER — CEFAZOLIN SODIUM-DEXTROSE 2-3 GM-% IV SOLR
2.0000 g | INTRAVENOUS | Status: AC
  Administered 2014-09-23: 2 g via INTRAVENOUS

## 2014-09-23 MED ORDER — OXYCODONE HCL 5 MG PO TABS
ORAL_TABLET | ORAL | Status: AC
  Filled 2014-09-23: qty 1

## 2014-09-23 MED ORDER — PROPOFOL 10 MG/ML IV BOLUS
INTRAVENOUS | Status: AC
  Filled 2014-09-23: qty 20

## 2014-09-23 MED ORDER — ONDANSETRON HCL 4 MG/2ML IJ SOLN
INTRAMUSCULAR | Status: DC | PRN
  Administered 2014-09-23: 4 mg via INTRAVENOUS

## 2014-09-23 MED ORDER — ONDANSETRON HCL 4 MG/2ML IJ SOLN: 4.0000 mg | Freq: Four times a day (QID) | INTRAMUSCULAR | Status: DC | PRN

## 2014-09-23 MED ORDER — FENTANYL CITRATE 0.05 MG/ML IJ SOLN
INTRAMUSCULAR | Status: DC | PRN
  Administered 2014-09-23: 50 ug via INTRAVENOUS

## 2014-09-23 MED ORDER — LACTATED RINGERS IV SOLN
INTRAVENOUS | Status: DC | PRN
  Administered 2014-09-23: 10:00:00 via INTRAVENOUS

## 2014-09-23 MED ORDER — MIDAZOLAM HCL 2 MG/2ML IJ SOLN: 1.0000 mg | INTRAMUSCULAR | Status: DC | PRN

## 2014-09-23 MED ORDER — CHLORHEXIDINE GLUCONATE 4 % EX LIQD: 60.0000 mL | Freq: Once | CUTANEOUS | Status: DC

## 2014-09-23 MED ORDER — PROPOFOL INFUSION 10 MG/ML OPTIME
INTRAVENOUS | Status: DC | PRN
  Administered 2014-09-23: 100 ug/kg/min via INTRAVENOUS

## 2014-09-23 MED ORDER — MIDAZOLAM HCL 2 MG/2ML IJ SOLN
INTRAMUSCULAR | Status: AC
  Filled 2014-09-23: qty 2

## 2014-09-23 MED ORDER — FENTANYL CITRATE 0.05 MG/ML IJ SOLN
25.0000 ug | INTRAMUSCULAR | Status: DC | PRN
Start: 2014-09-23 — End: 2014-09-23

## 2014-09-23 MED ORDER — LACTATED RINGERS IV SOLN: INTRAVENOUS | Status: DC

## 2014-09-23 MED ORDER — CEFAZOLIN SODIUM-DEXTROSE 2-3 GM-% IV SOLR: 2.0000 g | INTRAVENOUS | Status: DC

## 2014-09-23 MED ORDER — HYDROCODONE-ACETAMINOPHEN 5-325 MG PO TABS: 1.0000 | ORAL_TABLET | Freq: Four times a day (QID) | ORAL | Status: DC | PRN

## 2014-09-23 MED ORDER — CEFAZOLIN SODIUM-DEXTROSE 2-3 GM-% IV SOLR
INTRAVENOUS | Status: AC
  Filled 2014-09-23: qty 50

## 2014-09-23 MED ORDER — FENTANYL CITRATE 0.05 MG/ML IJ SOLN: 50.0000 ug | INTRAMUSCULAR | Status: DC | PRN

## 2014-09-23 MED ORDER — OXYCODONE HCL 5 MG PO TABS
5.0000 mg | ORAL_TABLET | Freq: Once | ORAL | Status: AC | PRN
  Administered 2014-09-23: 5 mg via ORAL

## 2014-09-23 MED ORDER — FENTANYL CITRATE 0.05 MG/ML IJ SOLN
INTRAMUSCULAR | Status: AC
  Filled 2014-09-23: qty 4

## 2014-09-23 MED ORDER — MIDAZOLAM HCL 5 MG/5ML IJ SOLN
INTRAMUSCULAR | Status: DC | PRN
  Administered 2014-09-23 (×2): 1 mg via INTRAVENOUS

## 2014-09-23 MED ORDER — OXYCODONE HCL 5 MG/5ML PO SOLN: 5.0000 mg | Freq: Once | ORAL | Status: AC | PRN

## 2014-09-23 MED ORDER — LIDOCAINE HCL (CARDIAC) 20 MG/ML IV SOLN
INTRAVENOUS | Status: DC | PRN
  Administered 2014-09-23: 50 mg via INTRAVENOUS

## 2014-09-23 MED ORDER — BUPIVACAINE HCL (PF) 0.25 % IJ SOLN
INTRAMUSCULAR | Status: DC | PRN
  Administered 2014-09-23: 6 mL

## 2014-09-23 SURGICAL SUPPLY — 37 items
BLADE SURG 15 STRL LF DISP TIS (BLADE) ×1 IMPLANT
BLADE SURG 15 STRL SS (BLADE) ×1
BNDG COHESIVE 3X5 TAN STRL LF (GAUZE/BANDAGES/DRESSINGS) ×2 IMPLANT
BNDG ESMARK 4X9 LF (GAUZE/BANDAGES/DRESSINGS) IMPLANT
BNDG GAUZE ELAST 4 BULKY (GAUZE/BANDAGES/DRESSINGS) ×2 IMPLANT
CHLORAPREP W/TINT 26ML (MISCELLANEOUS) ×2 IMPLANT
CORDS BIPOLAR (ELECTRODE) ×2 IMPLANT
COVER BACK TABLE 60X90IN (DRAPES) ×2 IMPLANT
COVER MAYO STAND STRL (DRAPES) ×2 IMPLANT
CUFF TOURNIQUET SINGLE 18IN (TOURNIQUET CUFF) ×2 IMPLANT
DRAPE EXTREMITY T 121X128X90 (DRAPE) ×2 IMPLANT
DRAPE SURG 17X23 STRL (DRAPES) ×2 IMPLANT
DRSG PAD ABDOMINAL 8X10 ST (GAUZE/BANDAGES/DRESSINGS) ×2 IMPLANT
GAUZE SPONGE 4X4 12PLY STRL (GAUZE/BANDAGES/DRESSINGS) ×2 IMPLANT
GAUZE XEROFORM 1X8 LF (GAUZE/BANDAGES/DRESSINGS) ×2 IMPLANT
GLOVE BIO SURGEON STRL SZ7.5 (GLOVE) ×2 IMPLANT
GLOVE BIOGEL PI IND STRL 7.0 (GLOVE) ×2 IMPLANT
GLOVE BIOGEL PI IND STRL 8 (GLOVE) ×1 IMPLANT
GLOVE BIOGEL PI IND STRL 8.5 (GLOVE) ×1 IMPLANT
GLOVE BIOGEL PI INDICATOR 7.0 (GLOVE) ×2
GLOVE BIOGEL PI INDICATOR 8 (GLOVE) ×1
GLOVE BIOGEL PI INDICATOR 8.5 (GLOVE) ×1
GLOVE ECLIPSE 6.5 STRL STRAW (GLOVE) ×2 IMPLANT
GLOVE SURG ORTHO 8.0 STRL STRW (GLOVE) ×2 IMPLANT
GOWN STRL REUS W/ TWL LRG LVL3 (GOWN DISPOSABLE) ×1 IMPLANT
GOWN STRL REUS W/TWL LRG LVL3 (GOWN DISPOSABLE) ×1
GOWN STRL REUS W/TWL XL LVL3 (GOWN DISPOSABLE) ×4 IMPLANT
NEEDLE PRECISIONGLIDE 27X1.5 (NEEDLE) ×2 IMPLANT
NS IRRIG 1000ML POUR BTL (IV SOLUTION) ×2 IMPLANT
PACK BASIN DAY SURGERY FS (CUSTOM PROCEDURE TRAY) ×2 IMPLANT
STOCKINETTE 4X48 STRL (DRAPES) ×2 IMPLANT
SUT ETHILON 4 0 PS 2 18 (SUTURE) ×2 IMPLANT
SUT VICRYL 4-0 PS2 18IN ABS (SUTURE) IMPLANT
SYR BULB 3OZ (MISCELLANEOUS) ×2 IMPLANT
SYR CONTROL 10ML LL (SYRINGE) ×2 IMPLANT
TOWEL OR 17X24 6PK STRL BLUE (TOWEL DISPOSABLE) ×2 IMPLANT
UNDERPAD 30X30 INCONTINENT (UNDERPADS AND DIAPERS) IMPLANT

## 2014-09-23 NOTE — Anesthesia Preprocedure Evaluation (Signed)
Anesthesia Evaluation  Patient identified by MRN, date of birth, ID band Patient awake    Reviewed: Allergy & Precautions, NPO status , Patient's Chart, lab work & pertinent test results  Airway Mallampati: II   Neck ROM: full    Dental   Pulmonary former smoker,          Cardiovascular hypertension,     Neuro/Psych    GI/Hepatic   Endo/Other  obese  Renal/GU      Musculoskeletal   Abdominal   Peds  Hematology   Anesthesia Other Findings   Reproductive/Obstetrics                             Anesthesia Physical Anesthesia Plan  ASA: II  Anesthesia Plan: MAC and Bier Block   Post-op Pain Management:    Induction: Intravenous  Airway Management Planned: Simple Face Mask  Additional Equipment:   Intra-op Plan:   Post-operative Plan:   Informed Consent: I have reviewed the patients History and Physical, chart, labs and discussed the procedure including the risks, benefits and alternatives for the proposed anesthesia with the patient or authorized representative who has indicated his/her understanding and acceptance.     Plan Discussed with: CRNA, Anesthesiologist and Surgeon  Anesthesia Plan Comments:         Anesthesia Quick Evaluation

## 2014-09-23 NOTE — Discharge Instructions (Addendum)

## 2014-09-23 NOTE — Op Note (Signed)
NAMLois Norman:  Pfalzgraf, SHERRY                ACCOUNT NO.:  [id number]  MEDICAL RECORD NO.:  [id number]  LOCATION:                                 FACILITY:  PHYSICIAN:  Cindee SaltGary Davelle Anselmi, M.D.            DATE OF BIRTH:  DATE OF PROCEDURE:  09/23/2014 DATE OF DISCHARGE:                              OPERATIVE REPORT   PREOPERATIVE DIAGNOSIS:  Carpal tunnel syndrome, right hand.  POSTOPERATIVE DIAGNOSIS:  Carpal tunnel syndrome, right hand.  OPERATION:  Decompression, right median nerve.  SURGEON:  Cindee SaltGary Chaynce Schafer, M.D.  ASSISTANT:  Betha LoaKevin Octavian Godek, MD  ANESTHESIA:  Forearm-based IV regional with local infiltration.  ANESTHESIOLOGIST:  Achille RichAdam Hodierne, MD.  HISTORY:  The patient is a 49 year old female with a history of bilateral carpal tunnel syndrome.  She has undergone release of left side.  She is admitted now for release of right.  Pre, peri, and postoperative course have been discussed along with risks and complications.  She is aware there is no guarantee with the surgery; possibility of infection; recurrence of injury to arteries, nerves, tendons; incomplete relief of symptoms; dystrophy.  In preoperative area, the patient is seen, the extremity marked by both the patient and surgeon.  Antibiotic given.  PROCEDURE IN DETAIL:  The patient was brought to the operating room where a forearm-based IV regional anesthetic was carried out without difficulty with the right arm free.  Time-out taken confirming the patient and procedure.  She was prepped using ChloraPrep.  A time-out taken.  A 3-minute dry time allowed.  After adequate anesthesia, the longitudinal incision was made in the right palm, carried down through subcutaneous tissue.  Bleeders were electrocauterized.  Palmar fascia was split.  The superficial palmar arch identified.  The flexor tendon to the ring and little finger identified to the ulnar side of the median nerve.  The carpal retinaculum was incised with sharp dissection.   A right-angle and Sewall retractor were placed between skin and forearm fascia.  The fascia was released for approximately 2 cm proximal to the wrist crease under direct vision.  Canal was explored.  Air compression to the nerve was apparent.  Motor branch entered into muscle.  No further lesions were identified.  The wound was irrigated with saline and closed with interrupted 4-0 nylon sutures.  Local infiltration with 0.25% bupivacaine without epinephrine was given approximately 6 mL was used.  Sterile compressive dressing was applied with the fingers free.  On deflation of the tourniquet, all fingers immediately pinked.  She was taken to the recovery room for observation in satisfactory condition.  She will be discharged home to [facility] week on [facility].          ______________________________ Cindee SaltGary Jovon Winterhalter, M.D.     GK/MEDQ  D:  09/23/2014  T:  09/23/2014  Job:  [id number]

## 2014-09-23 NOTE — Transfer of Care (Signed)
Immediate Anesthesia Transfer of Care Note  Patient: Gina BeaversSherri L Norman  Procedure(s) Performed: Procedure(s): RIGHT CARPAL TUNNEL RELEASE (Right)  Patient Location: PACU  Anesthesia Type:Bier block  Level of Consciousness: awake and patient cooperative  Airway & Oxygen Therapy: Patient Spontanous Breathing and Patient connected to face mask oxygen  Post-op Assessment: Report given to RN and Post -op Vital signs reviewed and stable  Post vital signs: Reviewed and stable  Last Vitals:  Filed Vitals:   09/23/14 1047  BP:   Pulse: 80  Temp:   Resp: 14    Complications: No apparent anesthesia complications

## 2014-09-23 NOTE — H&P (Signed)
Gina Norman is a 49 year old left hand dominant female with bilateral numbness and tingling on her left side on a constant basis and on her right side less frequently. This awakens her 3 out of 7 nights. She has no history of injury to the hand or neck. Her left side has been going on for approximately 8 years. She has been wearing a brace and taking ibuprofen which has not given her any relief. States rest has helped but does not always allow her to sleep at night. She has no history of diabetes or thyroid problems. She does have a history of arthritis. She has no history of gout. There is a family history of diabetes and she has been tested. She had symptoms while she was pregnant. She is complaining of some discomfort primarily on her right side, numbness and tingling on her left side is constant. She complains of intermittent, moderate, sharp, aching type pain with a feeling of weakness. She states it is getting worse. Activity makes it worse and rest makes it better. She has not had nerve conductions done at the present time. 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 She has recovered from her left carpal tunnel release.   PAST MEDICAL HISTORY:  She is allergic to E-Mycin. She is on Nifedipen, Lisinopril, she has had birth control, Singulair, Zyrtec, and probiotics. She has had a tonsillectomy.  FAMILY MEDICAL HISTORY: Positive for diabetes, high BP and arthritis.  SOCIAL HISTORY:  She does not smoke. She drinks socially. She is married.  REVIEW OF SYSTEMS: Positive for glasses, contacts, high BP, otherwise negative 14 points.  Gina Norman is an 49 y.o. female.   Chief Complaint: CTS right HPI: see above  Past Medical History  Diagnosis Date  . Hypertension   . Wears contact lenses   . ADD (attention deficit disorder)   . Complication of anesthesia     hard to wake up    Past Surgical History  Procedure Laterality Date  . Tonsillectomy    . Carpal tunnel release Left  08/14/2014    Procedure: LEFT CARPAL TUNNEL RELEASE;  Surgeon: Cindee SaltGary Edythe Riches, MD;  Location: [facility];  Service: Orthopedics;  Laterality: Left;  IV REGIONAL FAB    History reviewed. No pertinent family history. Social History:  reports that she quit smoking about 6 years ago. She does not have any smokeless tobacco history on file. She reports that she drinks alcohol. She reports that she does not use illicit drugs.  Allergies:  Allergies  Allergen Reactions  . Erythromycin Nausea Only    No prescriptions prior to admission    No results found for this or any previous visit (from the past 48 hour(s)).  No results found.   Pertinent items are noted in HPI.  Height 5\' 4"  (1.626 m), weight 95.255 kg (210 lb), last menstrual period 09/03/2014.  General appearance: alert, cooperative and appears stated age Head: Normocephalic, without obvious abnormality Neck: no JVD Resp: clear to auscultation bilaterally Cardio: regular rate and rhythm, S1, S2 normal, no murmur, click, rub or gallop GI: soft, non-tender; bowel sounds normal; no masses,  no organomegaly Extremities: numbness right hand Pulses: 2+ and symmetric Skin: Skin color, texture, turgor normal. No rashes or lesions Neurologic: Grossly normal Incision/Wound: na  Assessment/Plan She would like to schedule her right side. She will be scheduled for right carpal tunnel release as an outpatient under regional anesthesia. Pre, peri and post op care are discussed along with risks  and complications. Patient is aware there is no guarantee with surgery, possibility of infection, injury to arteries, nerves, and tendons, incomplete relief and dystrophy.   Gina Norman 09/23/2014, 4:18 AM

## 2014-09-23 NOTE — Brief Op Note (Signed)
09/23/2014  10:44 AM  PATIENT:  Gina Norman  49 y.o. female  PRE-OPERATIVE DIAGNOSIS:  RIGHT CARPAL TUNNEL SYNDROME  POST-OPERATIVE DIAGNOSIS:  RIGHT CARPAL TUNNEL SYNDROME  PROCEDURE:  Procedure(s): RIGHT CARPAL TUNNEL RELEASE (Right)  SURGEON:  Surgeon(s) and Role:    * Cindee SaltGary Jaretzy Lhommedieu, MD - Primary    * Betha LoaKevin Leighana Neyman, MD - Assisting  PHYSICIAN ASSISTANT:   ASSISTANTS: K Artavious Trebilcock,MD   ANESTHESIA:   local and regional  EBL:     BLOOD ADMINISTERED:none  DRAINS: none   LOCAL MEDICATIONS USED:  BUPIVICAINE   SPECIMEN:  No Specimen  DISPOSITION OF SPECIMEN:  N/A  COUNTS:  YES  TOURNIQUET:   Total Tourniquet Time Documented: Forearm (Right) - 16 minutes Total: Forearm (Right) - 16 minutes   DICTATION: .Other Dictation: Dictation Number [id number]  PLAN OF CARE: Discharge to home after PACU  PATIENT DISPOSITION:  PACU - hemodynamically stable.

## 2014-09-23 NOTE — Anesthesia Postprocedure Evaluation (Signed)
Anesthesia Post Note  Patient: Gina BeaversSherri L Norman  Procedure(s) Performed: Procedure(s) (LRB): RIGHT CARPAL TUNNEL RELEASE (Right)  Anesthesia type: MAC  Patient location: PACU  Post pain: Pain level controlled and Adequate analgesia  Post assessment: Post-op Vital signs reviewed, Patient's Cardiovascular Status Stable and Respiratory Function Stable  Last Vitals:  Filed Vitals:   09/23/14 1138  BP: 106/67  Pulse: 78  Temp: 37 C  Resp: 16    Post vital signs: Reviewed and stable  Level of consciousness: awake, alert  and oriented  Complications: No apparent anesthesia complications

## 2014-09-23 NOTE — Op Note (Signed)
Dictation Number [id number]

## 2014-09-24 ENCOUNTER — Encounter (HOSPITAL_BASED_OUTPATIENT_CLINIC_OR_DEPARTMENT_OTHER): Payer: Self-pay | Admitting: Orthopedic Surgery

## 2014-09-24 LAB — POCT I-STAT, CHEM 8
BUN: 17 mg/dL (ref 6–23)
CALCIUM ION: 1.08 mmol/L — AB (ref 1.12–1.23)
Chloride: 106 mmol/L (ref 96–112)
Creatinine, Ser: 0.7 mg/dL (ref 0.50–1.10)
Glucose, Bld: 116 mg/dL — ABNORMAL HIGH (ref 70–99)
HEMATOCRIT: 40 % (ref 36.0–46.0)
HEMOGLOBIN: 13.6 g/dL (ref 12.0–15.0)
Potassium: 3.5 mmol/L (ref 3.5–5.1)
SODIUM: 138 mmol/L (ref 135–145)
TCO2: 18 mmol/L (ref 0–100)

## 2015-08-05 ENCOUNTER — Other Ambulatory Visit: Payer: Self-pay | Admitting: Family Medicine

## 2015-08-05 ENCOUNTER — Ambulatory Visit
Admission: RE | Admit: 2015-08-05 | Discharge: 2015-08-05 | Disposition: A | Discharge status: Home / Self Care | Payer: 59 | Source: Ambulatory Visit | Attending: Family Medicine | Admitting: Family Medicine

## 2015-08-05 DIAGNOSIS — M545 Low back pain: Secondary | ICD-10-CM

## 2016-03-16 IMAGING — MG MM SCREEN MAMMOGRAM BILATERAL
4 series · 4 of 4 positions shown · non-contrast
Comparison: Previous exam(s).

CLINICAL DATA: Screening.

EXAM:
DIGITAL SCREENING BILATERAL MAMMOGRAM WITH CAD

[R CC]
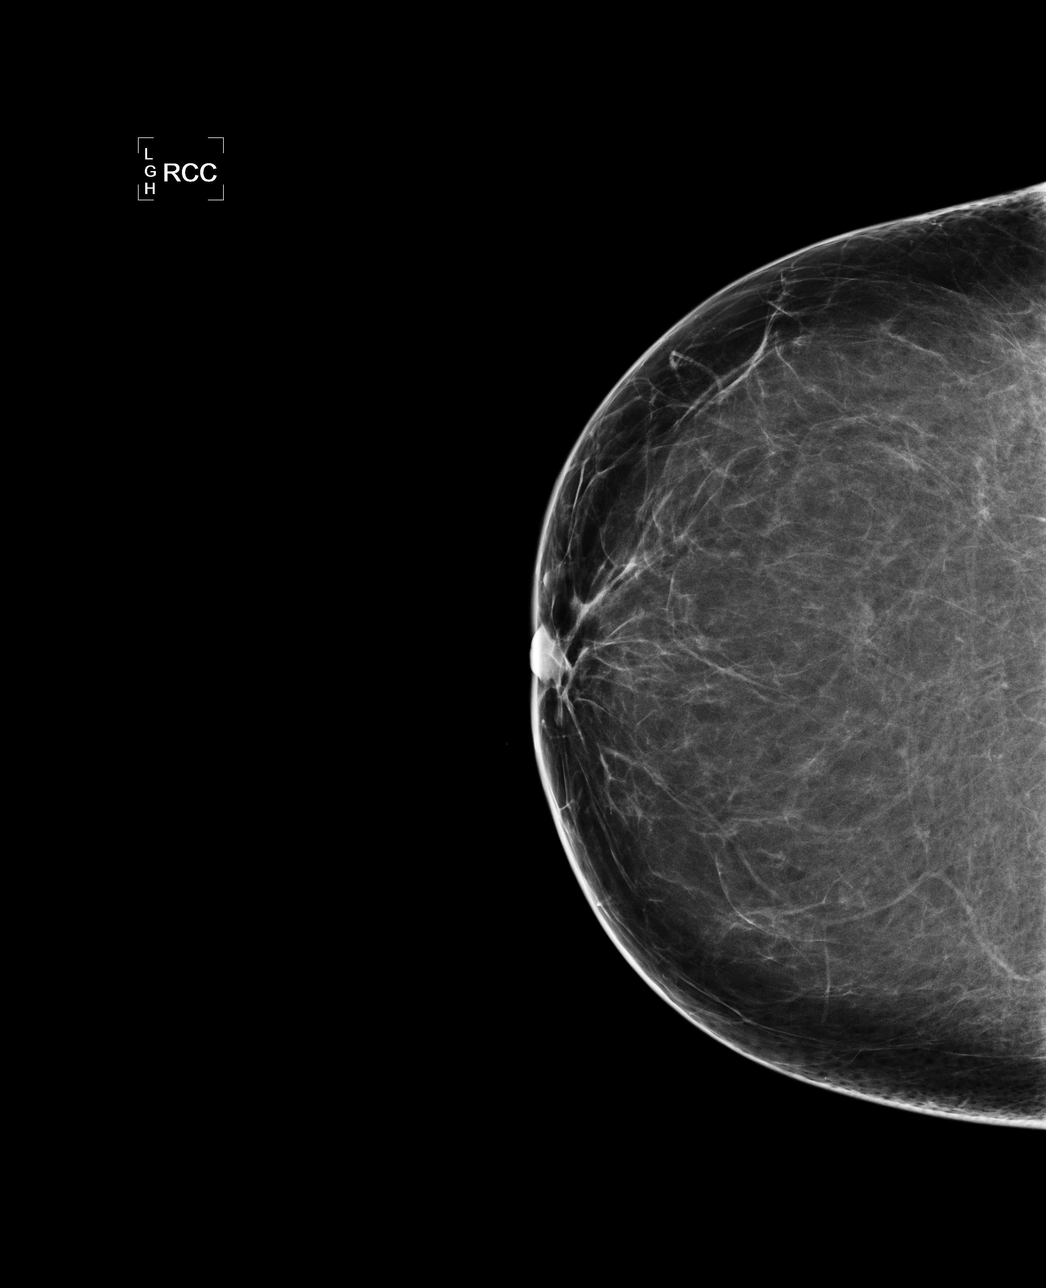

[L CC]
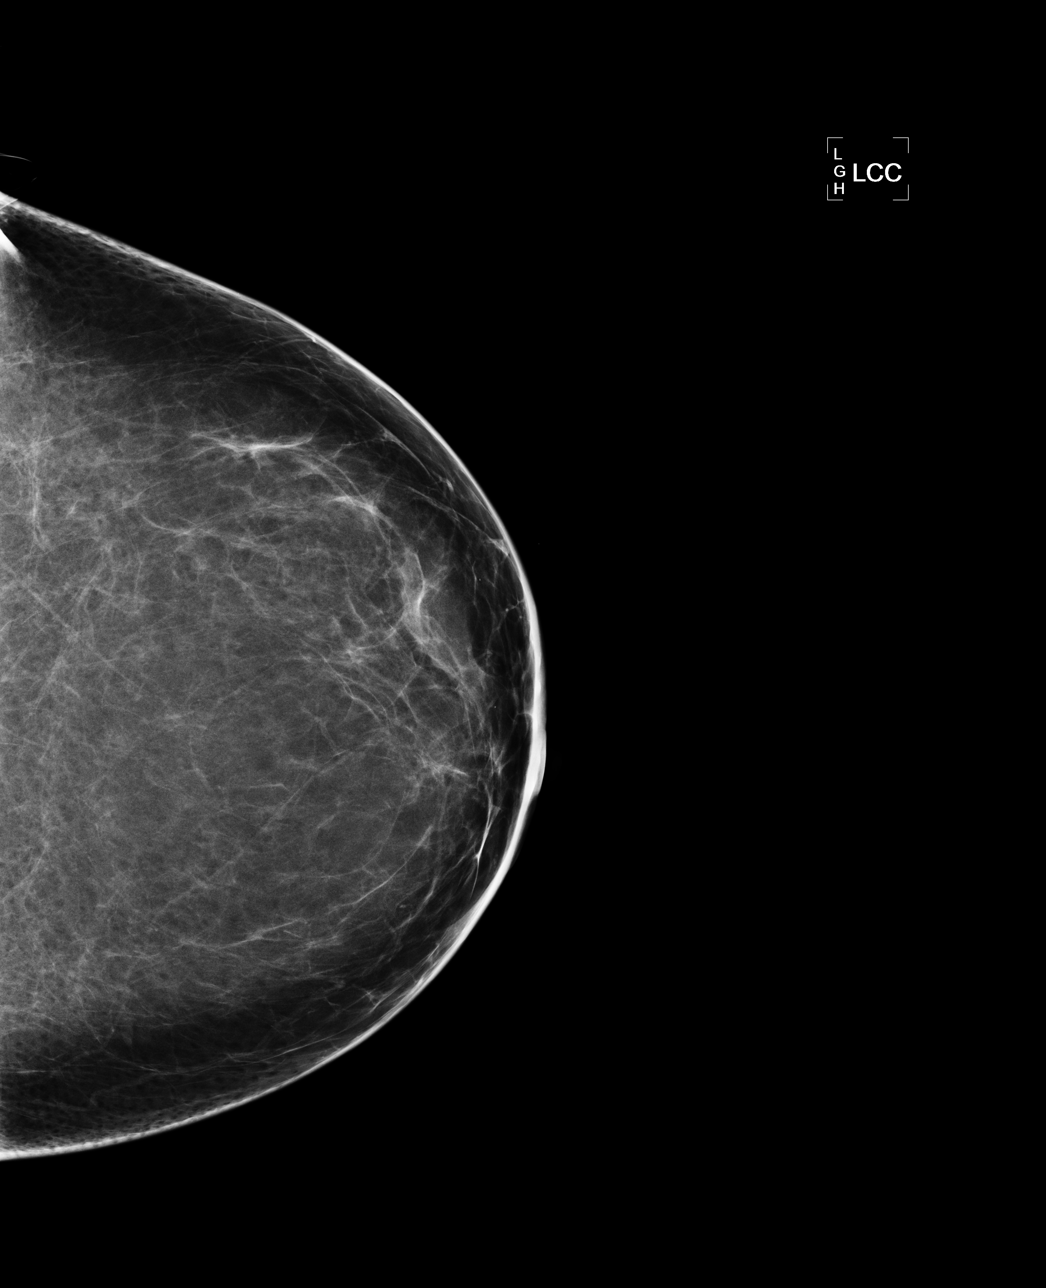

[L MLO]
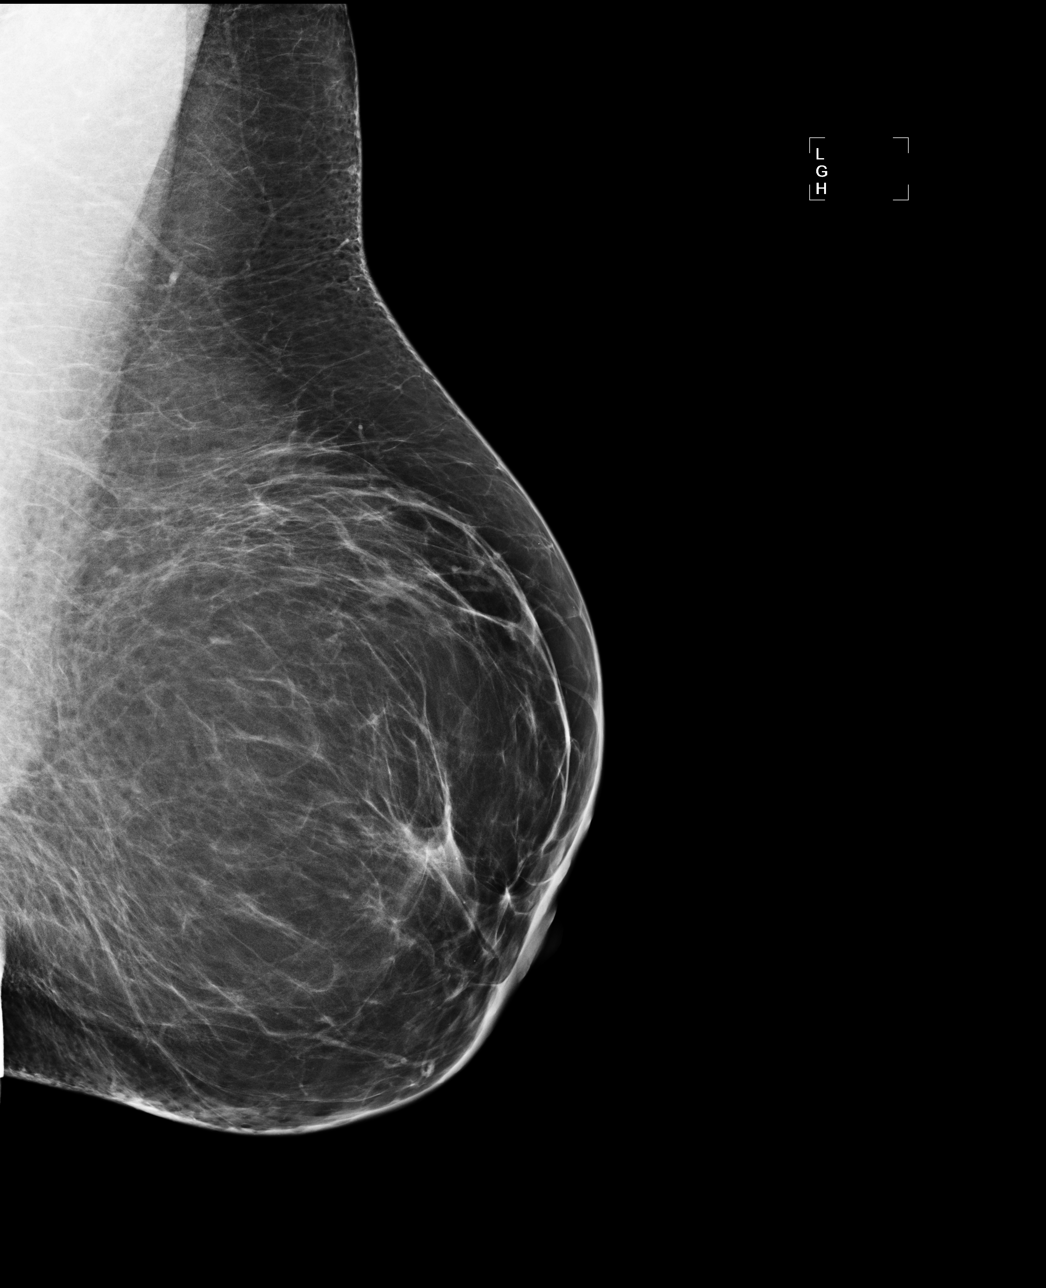

[R MLO]
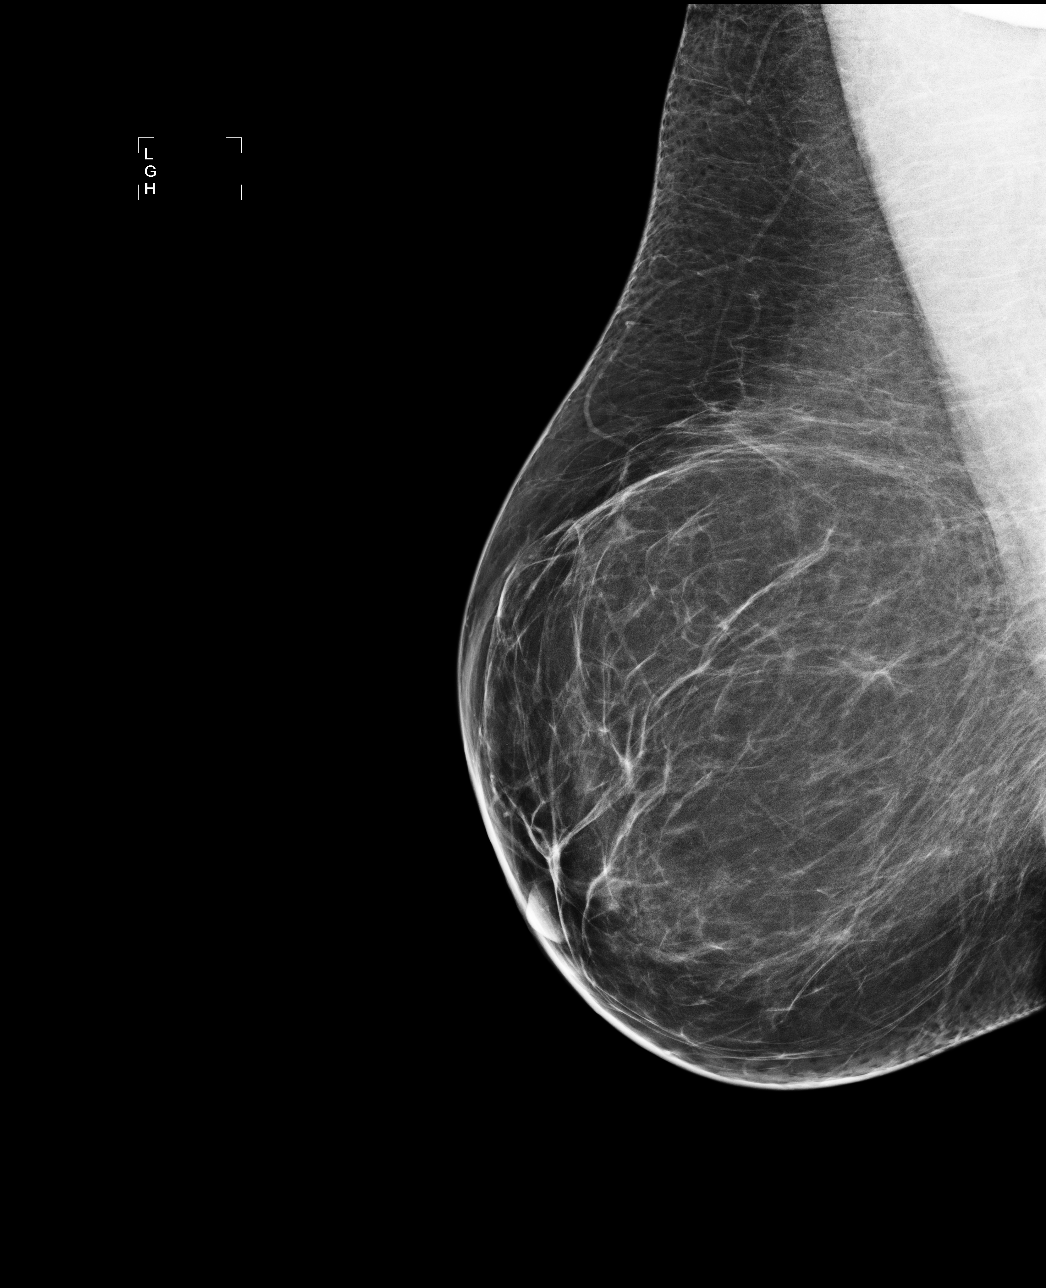

[4 of 4 positions shown; findings below may reference images not displayed]

ACR Breast Density Category b: There are scattered areas of
fibroglandular density.
FINDINGS: There are no findings suspicious for malignancy. Images were
processed with CAD.
IMPRESSION: No mammographic evidence of malignancy. A result letter of this
screening mammogram will be mailed directly to the patient.

RECOMMENDATION:
Screening mammogram in one year. (Code:AS-G-LCT)

BI-RADS CATEGORY  1: Negative.

## 2016-06-13 DIAGNOSIS — Z6835 Body mass index (BMI) 35.0-35.9, adult: Secondary | ICD-10-CM | POA: Diagnosis not present

## 2016-06-13 DIAGNOSIS — Z124 Encounter for screening for malignant neoplasm of cervix: Secondary | ICD-10-CM | POA: Diagnosis not present

## 2016-06-13 DIAGNOSIS — Z1231 Encounter for screening mammogram for malignant neoplasm of breast: Secondary | ICD-10-CM | POA: Diagnosis not present

## 2016-06-13 DIAGNOSIS — Z01419 Encounter for gynecological examination (general) (routine) without abnormal findings: Secondary | ICD-10-CM | POA: Diagnosis not present

## 2016-08-03 DIAGNOSIS — I10 Essential (primary) hypertension: Secondary | ICD-10-CM | POA: Diagnosis not present

## 2016-08-03 DIAGNOSIS — J0140 Acute pansinusitis, unspecified: Secondary | ICD-10-CM | POA: Diagnosis not present

## 2016-08-03 DIAGNOSIS — R05 Cough: Secondary | ICD-10-CM | POA: Diagnosis not present

## 2016-09-30 DIAGNOSIS — I10 Essential (primary) hypertension: Secondary | ICD-10-CM | POA: Diagnosis not present

## 2016-09-30 DIAGNOSIS — R000 Tachycardia, unspecified: Secondary | ICD-10-CM | POA: Diagnosis not present

## 2016-09-30 DIAGNOSIS — E782 Mixed hyperlipidemia: Secondary | ICD-10-CM | POA: Diagnosis not present

## 2016-12-07 DIAGNOSIS — M461 Sacroiliitis, not elsewhere classified: Secondary | ICD-10-CM | POA: Diagnosis not present

## 2016-12-16 ENCOUNTER — Other Ambulatory Visit: Payer: Self-pay | Admitting: Family Medicine

## 2016-12-16 DIAGNOSIS — M461 Sacroiliitis, not elsewhere classified: Secondary | ICD-10-CM

## 2016-12-16 DIAGNOSIS — M545 Low back pain: Secondary | ICD-10-CM

## 2016-12-19 ENCOUNTER — Ambulatory Visit
Admission: RE | Admit: 2016-12-19 | Discharge: 2016-12-19 | Disposition: A | Discharge status: Home / Self Care | Payer: 59 | Source: Ambulatory Visit | Attending: Family Medicine | Admitting: Family Medicine

## 2016-12-19 DIAGNOSIS — M545 Low back pain: Secondary | ICD-10-CM

## 2016-12-19 DIAGNOSIS — M5126 Other intervertebral disc displacement, lumbar region: Secondary | ICD-10-CM | POA: Diagnosis not present

## 2016-12-19 DIAGNOSIS — M461 Sacroiliitis, not elsewhere classified: Secondary | ICD-10-CM

## 2016-12-25 ENCOUNTER — Other Ambulatory Visit: Payer: 59

## 2016-12-27 DIAGNOSIS — M549 Dorsalgia, unspecified: Secondary | ICD-10-CM | POA: Diagnosis not present

## 2016-12-27 DIAGNOSIS — M48062 Spinal stenosis, lumbar region with neurogenic claudication: Secondary | ICD-10-CM | POA: Diagnosis not present

## 2016-12-27 DIAGNOSIS — M4726 Other spondylosis with radiculopathy, lumbar region: Secondary | ICD-10-CM | POA: Diagnosis not present

## 2017-01-03 DIAGNOSIS — M5431 Sciatica, right side: Secondary | ICD-10-CM | POA: Diagnosis not present

## 2017-01-03 DIAGNOSIS — M549 Dorsalgia, unspecified: Secondary | ICD-10-CM | POA: Diagnosis not present

## 2017-01-03 DIAGNOSIS — M48062 Spinal stenosis, lumbar region with neurogenic claudication: Secondary | ICD-10-CM | POA: Diagnosis not present

## 2017-01-17 DIAGNOSIS — M5431 Sciatica, right side: Secondary | ICD-10-CM | POA: Diagnosis not present

## 2017-02-06 DIAGNOSIS — M5431 Sciatica, right side: Secondary | ICD-10-CM | POA: Diagnosis not present

## 2017-03-09 DIAGNOSIS — Q762 Congenital spondylolisthesis: Secondary | ICD-10-CM | POA: Diagnosis not present

## 2017-03-09 DIAGNOSIS — M5431 Sciatica, right side: Secondary | ICD-10-CM | POA: Diagnosis not present

## 2017-03-09 DIAGNOSIS — M48062 Spinal stenosis, lumbar region with neurogenic claudication: Secondary | ICD-10-CM | POA: Diagnosis not present

## 2017-04-05 DIAGNOSIS — E782 Mixed hyperlipidemia: Secondary | ICD-10-CM | POA: Diagnosis not present

## 2017-04-05 DIAGNOSIS — I10 Essential (primary) hypertension: Secondary | ICD-10-CM | POA: Diagnosis not present

## 2017-04-05 DIAGNOSIS — Z23 Encounter for immunization: Secondary | ICD-10-CM | POA: Diagnosis not present

## 2017-06-26 DIAGNOSIS — M21619 Bunion of unspecified foot: Secondary | ICD-10-CM | POA: Diagnosis not present

## 2017-06-26 DIAGNOSIS — M2550 Pain in unspecified joint: Secondary | ICD-10-CM | POA: Diagnosis not present

## 2017-06-26 DIAGNOSIS — B351 Tinea unguium: Secondary | ICD-10-CM | POA: Diagnosis not present

## 2017-06-27 DIAGNOSIS — M5431 Sciatica, right side: Secondary | ICD-10-CM | POA: Diagnosis not present

## 2017-07-26 DIAGNOSIS — I10 Essential (primary) hypertension: Secondary | ICD-10-CM | POA: Insufficient documentation

## 2017-09-13 ENCOUNTER — Ambulatory Visit
Admission: RE | Admit: 2017-09-13 | Discharge: 2017-09-13 | Disposition: A | Discharge status: Home / Self Care | Payer: 59 | Source: Ambulatory Visit | Attending: Family Medicine | Admitting: Family Medicine

## 2017-09-13 ENCOUNTER — Other Ambulatory Visit: Payer: Self-pay | Admitting: Family Medicine

## 2017-09-13 DIAGNOSIS — M79642 Pain in left hand: Secondary | ICD-10-CM

## 2017-09-13 DIAGNOSIS — M79641 Pain in right hand: Secondary | ICD-10-CM

## 2017-12-12 DIAGNOSIS — M5431 Sciatica, right side: Secondary | ICD-10-CM | POA: Diagnosis not present

## 2017-12-28 DIAGNOSIS — Z1211 Encounter for screening for malignant neoplasm of colon: Secondary | ICD-10-CM | POA: Diagnosis not present

## 2017-12-28 DIAGNOSIS — K5901 Slow transit constipation: Secondary | ICD-10-CM | POA: Diagnosis not present

## 2018-01-15 DIAGNOSIS — Z1211 Encounter for screening for malignant neoplasm of colon: Secondary | ICD-10-CM | POA: Diagnosis not present

## 2018-01-15 DIAGNOSIS — K5730 Diverticulosis of large intestine without perforation or abscess without bleeding: Secondary | ICD-10-CM | POA: Diagnosis not present

## 2018-02-16 DIAGNOSIS — M4316 Spondylolisthesis, lumbar region: Secondary | ICD-10-CM | POA: Diagnosis not present

## 2018-02-16 DIAGNOSIS — M4726 Other spondylosis with radiculopathy, lumbar region: Secondary | ICD-10-CM | POA: Diagnosis not present

## 2018-02-16 DIAGNOSIS — M5431 Sciatica, right side: Secondary | ICD-10-CM | POA: Diagnosis not present

## 2018-02-16 DIAGNOSIS — M48062 Spinal stenosis, lumbar region with neurogenic claudication: Secondary | ICD-10-CM | POA: Diagnosis not present

## 2018-02-21 ENCOUNTER — Other Ambulatory Visit: Payer: Self-pay | Admitting: Orthopedic Surgery

## 2018-02-21 DIAGNOSIS — M4316 Spondylolisthesis, lumbar region: Secondary | ICD-10-CM

## 2018-02-22 ENCOUNTER — Ambulatory Visit
Admission: RE | Admit: 2018-02-22 | Discharge: 2018-02-22 | Disposition: A | Discharge status: Home / Self Care | Payer: BLUE CROSS/BLUE SHIELD | Source: Ambulatory Visit | Attending: Orthopedic Surgery | Admitting: Orthopedic Surgery

## 2018-02-22 DIAGNOSIS — M4316 Spondylolisthesis, lumbar region: Secondary | ICD-10-CM

## 2018-02-22 DIAGNOSIS — M48061 Spinal stenosis, lumbar region without neurogenic claudication: Secondary | ICD-10-CM | POA: Diagnosis not present

## 2018-03-02 DIAGNOSIS — M48062 Spinal stenosis, lumbar region with neurogenic claudication: Secondary | ICD-10-CM | POA: Diagnosis not present

## 2018-03-02 DIAGNOSIS — M5431 Sciatica, right side: Secondary | ICD-10-CM | POA: Diagnosis not present

## 2018-03-02 DIAGNOSIS — M4726 Other spondylosis with radiculopathy, lumbar region: Secondary | ICD-10-CM | POA: Diagnosis not present

## 2018-03-02 DIAGNOSIS — M4316 Spondylolisthesis, lumbar region: Secondary | ICD-10-CM | POA: Diagnosis not present

## 2018-04-06 ENCOUNTER — Emergency Department (HOSPITAL_BASED_OUTPATIENT_CLINIC_OR_DEPARTMENT_OTHER): Payer: BLUE CROSS/BLUE SHIELD

## 2018-04-06 ENCOUNTER — Encounter (HOSPITAL_BASED_OUTPATIENT_CLINIC_OR_DEPARTMENT_OTHER): Payer: Self-pay | Admitting: Emergency Medicine

## 2018-04-06 ENCOUNTER — Other Ambulatory Visit: Payer: Self-pay

## 2018-04-06 ENCOUNTER — Emergency Department (HOSPITAL_BASED_OUTPATIENT_CLINIC_OR_DEPARTMENT_OTHER)
Admission: EM | Admit: 2018-04-06 | Discharge: 2018-04-07 | Disposition: A | Discharge status: Home / Self Care | Payer: BLUE CROSS/BLUE SHIELD | Attending: Emergency Medicine | Admitting: Emergency Medicine

## 2018-04-06 DIAGNOSIS — R600 Localized edema: Secondary | ICD-10-CM | POA: Insufficient documentation

## 2018-04-06 DIAGNOSIS — Z87891 Personal history of nicotine dependence: Secondary | ICD-10-CM | POA: Insufficient documentation

## 2018-04-06 DIAGNOSIS — Y999 Unspecified external cause status: Secondary | ICD-10-CM | POA: Diagnosis not present

## 2018-04-06 DIAGNOSIS — I10 Essential (primary) hypertension: Secondary | ICD-10-CM | POA: Insufficient documentation

## 2018-04-06 DIAGNOSIS — Y9301 Activity, walking, marching and hiking: Secondary | ICD-10-CM | POA: Insufficient documentation

## 2018-04-06 DIAGNOSIS — S82845A Nondisplaced bimalleolar fracture of left lower leg, initial encounter for closed fracture: Secondary | ICD-10-CM | POA: Diagnosis not present

## 2018-04-06 DIAGNOSIS — W108XXA Fall (on) (from) other stairs and steps, initial encounter: Secondary | ICD-10-CM | POA: Insufficient documentation

## 2018-04-06 DIAGNOSIS — R231 Pallor: Secondary | ICD-10-CM | POA: Diagnosis not present

## 2018-04-06 DIAGNOSIS — S82842A Displaced bimalleolar fracture of left lower leg, initial encounter for closed fracture: Secondary | ICD-10-CM

## 2018-04-06 DIAGNOSIS — Y929 Unspecified place or not applicable: Secondary | ICD-10-CM | POA: Insufficient documentation

## 2018-04-06 DIAGNOSIS — Z79899 Other long term (current) drug therapy: Secondary | ICD-10-CM | POA: Insufficient documentation

## 2018-04-06 DIAGNOSIS — S82432A Displaced oblique fracture of shaft of left fibula, initial encounter for closed fracture: Secondary | ICD-10-CM | POA: Diagnosis not present

## 2018-04-06 DIAGNOSIS — M79641 Pain in right hand: Secondary | ICD-10-CM | POA: Diagnosis not present

## 2018-04-06 DIAGNOSIS — S6991XA Unspecified injury of right wrist, hand and finger(s), initial encounter: Secondary | ICD-10-CM | POA: Diagnosis not present

## 2018-04-06 DIAGNOSIS — S99912A Unspecified injury of left ankle, initial encounter: Secondary | ICD-10-CM | POA: Diagnosis not present

## 2018-04-06 DIAGNOSIS — T148XXA Other injury of unspecified body region, initial encounter: Secondary | ICD-10-CM

## 2018-04-06 DIAGNOSIS — W19XXXA Unspecified fall, initial encounter: Secondary | ICD-10-CM

## 2018-04-06 HISTORY — DX: Other intervertebral disc degeneration, lumbosacral region: M51.37

## 2018-04-06 HISTORY — DX: Dorsalgia, unspecified: M54.9

## 2018-04-06 HISTORY — DX: Other intervertebral disc degeneration, lumbosacral region without mention of lumbar back pain or lower extremity pain: M51.379

## 2018-04-06 MED ORDER — ONDANSETRON HCL 4 MG/2ML IJ SOLN
4.0000 mg | Freq: Once | INTRAMUSCULAR | Status: AC
  Administered 2018-04-06: 4 mg via INTRAVENOUS
  Filled 2018-04-06: qty 2

## 2018-04-06 MED ORDER — OXYCODONE-ACETAMINOPHEN 5-325 MG PO TABS: 2.0000 | ORAL_TABLET | Freq: Four times a day (QID) | ORAL | 0 refills | Status: DC | PRN

## 2018-04-06 MED ORDER — MORPHINE SULFATE (PF) 4 MG/ML IV SOLN
4.0000 mg | Freq: Once | INTRAVENOUS | Status: AC
  Administered 2018-04-06: 4 mg via INTRAVENOUS
  Filled 2018-04-06: qty 1

## 2018-04-06 MED ORDER — KETAMINE HCL 10 MG/ML IJ SOLN
1.0000 mg/kg | Freq: Once | INTRAMUSCULAR | Status: AC
  Administered 2018-04-06: 93 mg via INTRAVENOUS
  Filled 2018-04-06: qty 1

## 2018-04-06 MED ORDER — FENTANYL CITRATE (PF) 100 MCG/2ML IJ SOLN
50.0000 ug | Freq: Once | INTRAMUSCULAR | Status: AC
  Administered 2018-04-06: 50 ug via INTRAVENOUS
  Filled 2018-04-06: qty 2

## 2018-04-06 MED ORDER — OXYCODONE-ACETAMINOPHEN 5-325 MG PO TABS
2.0000 | ORAL_TABLET | Freq: Once | ORAL | Status: AC
  Administered 2018-04-07: 2 via ORAL
  Filled 2018-04-06: qty 2

## 2018-04-06 NOTE — ED Notes (Signed)
Pt requested more pain meds before coming over for imaging

## 2018-04-06 NOTE — ED Notes (Signed)
Husband was given instructions on how care for the splint. Patient is sedated at the time. Husband is aware of RICE.

## 2018-04-06 NOTE — ED Provider Notes (Signed)
[facility] EMERGENCY DEPARTMENT Provider Note   CSN: [id number] Arrival date & time: 04/06/18  1828     History   Chief Complaint Chief Complaint  Patient presents with  . Fall    HPI Gina Norman is a 52 y.o. female.  52 year old female with past medical history below who presents with fall and left ankle pain.  Just prior to arrival, the patient fell backwards down stairs and felt a pop on her left ankle.  She had an immediate onset of severe, constant left ankle pain.  She reports mild pain in her right hand between her fourth and fifth fingers.  She does not recall hitting her head and did not lose consciousness.  She denies any neck, back, chest, abdominal, or hip pain.  Normal sensation in her foot.  No anticoagulant use.  The history is provided by the patient.  Fall     Past Medical History:  Diagnosis Date  . ADD (attention deficit disorder)   . Back pain   . Complication of anesthesia    hard to wake up  . DDD (degenerative disc disease), lumbosacral   . Hypertension   . Wears contact lenses     There are no active problems to display for this patient.   Past Surgical History:  Procedure Laterality Date  . CARPAL TUNNEL RELEASE Left 08/14/2014   Procedure: LEFT CARPAL TUNNEL RELEASE;  Surgeon: Cindee Salt, MD;  Location: [facility];  Service: Orthopedics;  Laterality: Left;  IV REGIONAL FAB  . CARPAL TUNNEL RELEASE Right 09/23/2014   Procedure: RIGHT CARPAL TUNNEL RELEASE;  Surgeon: Cindee Salt, MD;  Location: [facility];  Service: Orthopedics;  Laterality: Right;  . TONSILLECTOMY       OB History   None      Home Medications    Prior to Admission medications   Medication Sig Start Date End Date Taking? Authorizing Provider  amphetamine-dextroamphetamine (ADDERALL) 15 MG tablet TAKE 1 TABLET BY MOUTH IN THE MORNING AND 1/2-1 ADDITIONAL TAB AT LUNCH IF NEEDED. 01/10/18   [provider]  cyclobenzaprine  (FLEXERIL) 5 MG tablet Take 5 mg by mouth 3 (three) times daily as needed. 02/08/18   [provider]  etodolac (LODINE) 400 MG tablet TAKE 1 TABLET TWICE A DAY AS NEEDED FOR PAIN ORALLY 02/08/18   [provider]  gabapentin (NEURONTIN) 300 MG capsule gabapentin 300 mg capsule  TAKE 1 TO 2 CAPSULES BY MOUTH 3 TIMES A DAY    [provider]  lisinopril-hydrochlorothiazide (PRINZIDE,ZESTORETIC) 10-12.5 MG per tablet Take 1 tablet by mouth daily.    [provider]  metoprolol succinate (TOPROL-XL) 50 MG 24 hr tablet Take 50 mg by mouth daily. 01/30/18   [provider]  montelukast (SINGULAIR) 10 MG tablet every evening. 01/18/18   [provider]  naproxen sodium (ANAPROX) 220 MG tablet Take 440 mg by mouth daily as needed. For pain    [provider]  oxyCODONE-acetaminophen (PERCOCET) 5-325 MG tablet Take 2 tablets by mouth every 6 (six) hours as needed for severe pain. 04/06/18   Derral Colucci, Ambrose Finland, MD    Family History No family history on file.  Social History Social History   Tobacco Use  . Smoking status: Former Smoker    Last attempt to quit: 08/12/2008    Years since quitting: 9.6  . Smokeless tobacco: Never Used  Substance Use Topics  . Alcohol use: Yes  . Drug use: No  Allergies   Erythromycin   Review of Systems Review of Systems All other systems reviewed and are negative except that which was mentioned in HPI   Physical Exam Updated Vital Signs BP 109/66   Pulse 76   Temp 97.7 F (36.5 C) (Oral)   Resp 16   Ht 5\' 4"  (1.626 m)   Wt 93 kg   LMP 09/03/2014   SpO2 94%   BMI 35.19 kg/m   Physical Exam  Constitutional: She is oriented to person, place, and time. She appears well-developed and well-nourished. No distress.  Mild distress due to pain  HENT:  Head: Normocephalic and atraumatic.  Moist mucous membranes  Eyes: Pupils are equal, round, and reactive to light. Conjunctivae are normal.    Neck: Neck supple.  Cardiovascular: Normal rate, regular rhythm, normal heart sounds and intact distal pulses.  No murmur heard. Pulmonary/Chest: Effort normal and breath sounds normal. She exhibits no tenderness.  Abdominal: Soft. Bowel sounds are normal. She exhibits no distension. There is no tenderness.  Musculoskeletal: She exhibits edema, tenderness and deformity.  Closed deformity of left ankle with edema; mild tenderness R hand between 4th and 5th MCP joints, trace swelling  Neurological: She is alert and oriented to person, place, and time.  Fluent speech  Skin: Skin is warm and dry. There is pallor.  Psychiatric: She has a normal mood and affect. Judgment normal.  Nursing note and vitals reviewed.    ED Treatments / Results  Labs (all labs ordered are listed, but only abnormal results are displayed) Labs Reviewed - No data to display  EKG None  Radiology Dg Tibia/fibula Left  Result Date: 04/06/2018 CLINICAL DATA:  Status post fall.  Pain. EXAM: LEFT TIBIA AND FIBULA - 2 VIEW COMPARISON:  None. FINDINGS: Oblique displaced fracture of the distal fibular metaphysis with 4 mm lateral displacement. Transverse displaced medial malleolar fracture with 6 mm of lateral displacement. Lateral subluxation of the talar dome relative to the tibial plafond and with widening of the medial tibiotalar joint. Soft tissue swelling around the ankle. IMPRESSION: 1. Oblique displaced fracture of the distal fibular metaphysis with 4 mm lateral displacement. 2. Transverse displaced medial malleolar fracture with 6 mm of lateral displacement. 3. Lateral subluxation of the talar dome relative to the tibial plafond and with widening of the medial tibiotalar joint. Electronically Signed   By: Elige Ko   On: 04/06/2018 21:00   Dg Ankle Complete Left  Result Date: 04/06/2018 CLINICAL DATA:  Pain after trauma EXAM: LEFT ANKLE COMPLETE - 3+ VIEW COMPARISON:  None. FINDINGS: There is a displaced  fracture of the lateral malleolus. There is a fracture through the medial malleolus. There is disruption of the ankle mortise with widening medially. No other definitive fractures. IMPRESSION: Bimalleolar ankle fracture with disruption of the ankle mortise and widening medially. Electronically Signed   By: Gerome Sam III M.D   On: 04/06/2018 20:56   Dg Ankle Left Port  Result Date: 04/06/2018 CLINICAL DATA:  Post reduction EXAM: PORTABLE LEFT ANKLE - 2 VIEW COMPARISON:  04/06/2018 at 2027 hours FINDINGS: Status post tibiotalar reduction. Nondisplaced fractures involving the medial malleolus and distal fibula. Associated soft tissue swelling. Overlying cast obscures fine osseous detail. IMPRESSION: Status post tibiotalar reduction. Bimalleolar fractures, nondisplaced. Electronically Signed   By: Charline Bills M.D.   On: 04/06/2018 23:29   Dg Hand Complete Right  Result Date: 04/06/2018 CLINICAL DATA:  Pain after fall EXAM: RIGHT HAND - COMPLETE 3+ VIEW COMPARISON:  None. FINDINGS: There is no evidence of fracture or dislocation. There is no evidence of arthropathy or other focal bone abnormality. Soft tissues are unremarkable. IMPRESSION: Negative. Electronically Signed   By: Gerome Sam III M.D   On: 04/06/2018 20:57    Procedures Reduction of fracture Date/Time: 04/06/2018 11:42 PM Performed by: Laurence Spates, MD Authorized by: Laurence Spates, MD  Consent: Verbal consent obtained. Written consent obtained. Risks and benefits: risks, benefits and alternatives were discussed Consent given by: patient and spouse Required items: required blood products, implants, devices, and special equipment available Patient identity confirmed: verbally with patient Time out: Immediately prior to procedure a "time out" was called to verify the correct patient, procedure, equipment, support staff and site/side marked as required. Local anesthesia used: no  Anesthesia: Local  anesthesia used: no  Sedation: Patient sedated: yes Sedation type: moderate (conscious) sedation Sedatives: ketamine Vitals: Vital signs were monitored during sedation.  Patient tolerance: Patient tolerated the procedure well with no immediate complications Comments: Traction with medial pressure applied to lateral malleolus  .Sedation Date/Time: 04/06/2018 11:43 PM Performed by: Laurence Spates, MD Authorized by: Laurence Spates, MD   Consent:    Consent obtained:  Written   Consent given by:  Patient and spouse   Risks discussed:  Allergic reaction, inadequate sedation, respiratory compromise necessitating ventilatory assistance and intubation, prolonged sedation necessitating reversal and prolonged hypoxia resulting in organ damage   Alternatives discussed:  Analgesia without sedation Universal protocol:    Immediately prior to procedure a time out was called: yes   Indications:    Procedure performed:  Fracture reduction   Procedure necessitating sedation performed by:  Physician performing sedation   Intended level of sedation:  Deep Pre-sedation assessment:    Time since last food or drink:  3   NPO status caution: urgency dictates proceeding with non-ideal NPO status     ASA classification: class 2 - patient with mild systemic disease     Neck mobility: normal     Mouth opening:  3 or more finger widths   Thyromental distance:  4 finger widths   Mallampati score:  IV - only hard palate visible   Pre-sedation assessments completed and reviewed: airway patency, cardiovascular function, hydration status, pain level and respiratory function   Immediate pre-procedure details:    Reassessment: Patient reassessed immediately prior to procedure     Reviewed: vital signs and relevant labs/tests     Verified: bag valve mask available, emergency equipment available, intubation equipment available, IV patency confirmed, oxygen available and suction available   Procedure  details (see MAR for exact dosages):    Preoxygenation:  Nasal cannula   Sedation:  Ketamine   Intra-procedure monitoring:  Blood pressure monitoring, cardiac monitor, continuous pulse oximetry, continuous capnometry, frequent LOC assessments and frequent vital sign checks   Intra-procedure events: none     Total Provider sedation time (minutes):  7 Post-procedure details:    Attendance: Constant attendance by certified staff until patient recovered     Recovery: Patient returned to pre-procedure baseline     Post-sedation assessments completed and reviewed: airway patency, mental status, nausea/vomiting, pain level and respiratory function     Patient is stable for discharge or admission: yes     Patient tolerance:  Tolerated well, no immediate complications   (including critical care time)  Medications Ordered in ED Medications  oxyCODONE-acetaminophen (PERCOCET/ROXICET) 5-325 MG per tablet 2 tablet (has no administration in time range)  fentaNYL (SUBLIMAZE) injection  50 mcg (50 mcg Intravenous Given 04/06/18 1938)  morphine 4 MG/ML injection 4 mg (4 mg Intravenous Given 04/06/18 2009)  morphine 4 MG/ML injection 4 mg (4 mg Intravenous Given 04/06/18 2122)  ondansetron (ZOFRAN) injection 4 mg (4 mg Intravenous Given 04/06/18 2222)  ketamine (KETALAR) injection 93 mg (93 mg Intravenous Given 04/06/18 2235)     Initial Impression / Assessment and Plan / ED Course  I have reviewed the triage vital signs and the nursing notes.  Pertinent imaging results that were available during my care of the patient were reviewed by me and considered in my medical decision making (see chart for details).     Patient was initially hypotensive at presentation but was in severe pain and notes that she has fainted in the past with trauma and pain.  Her blood pressure quickly improved with pain medication and fluids, I suspect vagal response.  Neurovascularly intact distally.  Plain films show displaced  bimalleolar fracture with disruption of mortise and lateral subluxation of talar dome.  I discussed injury with orthopedics, Dr. Ave Filter, who reviewed images.  He recommended procedural sedation for reduction to achieve better alignment prior to splinting.  He will see the patient on Monday in the clinic to discuss surgical management.  Obtain consent for sedation and reduction, see procedure note for details.  Repeat films show improved alignment in splint.  I discussed splint management, pain control, follow-up plan, and return precautions with the patient and her spouse.  They voiced understanding.  On reassessment after sedation, patient was sitting up and drinking water.  Her pain is adequately controlled.  Discharged in satisfactory condition.  Final Clinical Impressions(s) / ED Diagnoses   Final diagnoses:  Fracture  Bimalleolar fracture of left ankle, closed, initial encounter  Fall, initial encounter    ED Discharge Orders         Ordered    oxyCODONE-acetaminophen (PERCOCET) 5-325 MG tablet  Every 6 hours PRN     04/06/18 2341           Kenyah Luba, Ambrose Finland, MD 04/06/18 2346

## 2018-04-06 NOTE — ED Triage Notes (Signed)
Pt fell down steps today, injured left ankle and right 4th and 5th fingers.  Pt unsure if she hit her head.  No LOC.

## 2018-04-06 NOTE — Sedation Documentation (Signed)
Pt is alert, drowsy. Family at bedside. Splint applied. REduction complete.

## 2018-04-07 NOTE — ED Notes (Signed)
Patient vomited a large amount of yellowish vomitus.  MD notified with new order and stated to let the med settle for 15 minutes prior to giving the pain med.  Patient is made aware of the plan.

## 2018-04-09 ENCOUNTER — Other Ambulatory Visit: Payer: Self-pay | Admitting: Orthopedic Surgery

## 2018-04-09 DIAGNOSIS — M25572 Pain in left ankle and joints of left foot: Secondary | ICD-10-CM | POA: Diagnosis not present

## 2018-04-10 ENCOUNTER — Encounter (HOSPITAL_COMMUNITY): Payer: Self-pay | Admitting: *Deleted

## 2018-04-10 NOTE — Progress Notes (Signed)
Patient denies CP, shob, or cardiology visit. Left message with Katie at Dr. Veda Canning office requesting orders.

## 2018-04-11 ENCOUNTER — Other Ambulatory Visit: Payer: Self-pay | Admitting: Orthopaedic Surgery

## 2018-04-12 ENCOUNTER — Ambulatory Visit (HOSPITAL_COMMUNITY): Payer: BLUE CROSS/BLUE SHIELD

## 2018-04-12 ENCOUNTER — Other Ambulatory Visit: Payer: Self-pay

## 2018-04-12 ENCOUNTER — Encounter (HOSPITAL_COMMUNITY)
Admission: RE | Disposition: A | Discharge status: Home / Self Care | Payer: Self-pay | Source: Ambulatory Visit | Attending: Orthopaedic Surgery

## 2018-04-12 ENCOUNTER — Ambulatory Visit (HOSPITAL_COMMUNITY): Payer: BLUE CROSS/BLUE SHIELD | Admitting: Anesthesiology

## 2018-04-12 ENCOUNTER — Ambulatory Visit (HOSPITAL_COMMUNITY)
Admission: RE | Admit: 2018-04-12 | Discharge: 2018-04-12 | Disposition: A | Discharge status: Home / Self Care | Payer: BLUE CROSS/BLUE SHIELD | Source: Ambulatory Visit | Attending: Orthopaedic Surgery | Admitting: Orthopaedic Surgery

## 2018-04-12 ENCOUNTER — Encounter (HOSPITAL_COMMUNITY): Payer: Self-pay | Admitting: *Deleted

## 2018-04-12 DIAGNOSIS — Z79899 Other long term (current) drug therapy: Secondary | ICD-10-CM | POA: Diagnosis not present

## 2018-04-12 DIAGNOSIS — I10 Essential (primary) hypertension: Secondary | ICD-10-CM | POA: Diagnosis not present

## 2018-04-12 DIAGNOSIS — G8918 Other acute postprocedural pain: Secondary | ICD-10-CM | POA: Diagnosis not present

## 2018-04-12 DIAGNOSIS — F988 Other specified behavioral and emotional disorders with onset usually occurring in childhood and adolescence: Secondary | ICD-10-CM | POA: Insufficient documentation

## 2018-04-12 DIAGNOSIS — S8262XA Displaced fracture of lateral malleolus of left fibula, initial encounter for closed fracture: Secondary | ICD-10-CM | POA: Diagnosis not present

## 2018-04-12 DIAGNOSIS — S8252XA Displaced fracture of medial malleolus of left tibia, initial encounter for closed fracture: Secondary | ICD-10-CM | POA: Diagnosis not present

## 2018-04-12 DIAGNOSIS — Z87891 Personal history of nicotine dependence: Secondary | ICD-10-CM | POA: Diagnosis not present

## 2018-04-12 DIAGNOSIS — S82842A Displaced bimalleolar fracture of left lower leg, initial encounter for closed fracture: Secondary | ICD-10-CM | POA: Insufficient documentation

## 2018-04-12 DIAGNOSIS — W109XXA Fall (on) (from) unspecified stairs and steps, initial encounter: Secondary | ICD-10-CM | POA: Diagnosis not present

## 2018-04-12 DIAGNOSIS — Z419 Encounter for procedure for purposes other than remedying health state, unspecified: Secondary | ICD-10-CM

## 2018-04-12 HISTORY — PX: ORIF ANKLE FRACTURE: SHX5408

## 2018-04-12 LAB — CBC
HEMATOCRIT: 40.3 % (ref 36.0–46.0)
HEMOGLOBIN: 12.8 g/dL (ref 12.0–15.0)
MCH: 31.3 pg (ref 26.0–34.0)
MCHC: 31.8 g/dL (ref 30.0–36.0)
MCV: 98.5 fL (ref 78.0–100.0)
Platelets: 165 10*3/uL (ref 150–400)
RBC: 4.09 MIL/uL (ref 3.87–5.11)
RDW: 11.9 % (ref 11.5–15.5)
WBC: 6.7 10*3/uL (ref 4.0–10.5)

## 2018-04-12 LAB — BASIC METABOLIC PANEL
Anion gap: 13 (ref 5–15)
BUN: 13 mg/dL (ref 6–20)
CHLORIDE: 103 mmol/L (ref 98–111)
CO2: 22 mmol/L (ref 22–32)
Calcium: 9.3 mg/dL (ref 8.9–10.3)
Creatinine, Ser: 0.83 mg/dL (ref 0.44–1.00)
GFR calc non Af Amer: >60 mL/min (ref >60)
Glucose, Bld: 100 mg/dL — ABNORMAL HIGH (ref 70–99)
POTASSIUM: 4 mmol/L (ref 3.5–5.1)
Sodium: 138 mmol/L (ref 135–145)

## 2018-04-12 SURGERY — OPEN REDUCTION INTERNAL FIXATION (ORIF) ANKLE FRACTURE
Anesthesia: General | Site: Ankle | Laterality: Left

## 2018-04-12 MED ORDER — ONDANSETRON HCL 4 MG/2ML IJ SOLN: 4.0000 mg | Freq: Once | INTRAMUSCULAR | Status: DC | PRN

## 2018-04-12 MED ORDER — LIDOCAINE 2% (20 MG/ML) 5 ML SYRINGE
INTRAMUSCULAR | Status: AC
  Filled 2018-04-12: qty 5

## 2018-04-12 MED ORDER — MIDAZOLAM HCL 2 MG/2ML IJ SOLN
INTRAMUSCULAR | Status: AC
  Filled 2018-04-12: qty 2

## 2018-04-12 MED ORDER — MIDAZOLAM HCL 2 MG/2ML IJ SOLN
1.0000 mg | Freq: Once | INTRAMUSCULAR | Status: AC
  Administered 2018-04-12: 1 mg via INTRAVENOUS

## 2018-04-12 MED ORDER — ONDANSETRON HCL 4 MG/2ML IJ SOLN
INTRAMUSCULAR | Status: DC | PRN
  Administered 2018-04-12: 4 mg via INTRAVENOUS

## 2018-04-12 MED ORDER — FENTANYL CITRATE (PF) 100 MCG/2ML IJ SOLN: 25.0000 ug | INTRAMUSCULAR | Status: DC | PRN

## 2018-04-12 MED ORDER — FENTANYL CITRATE (PF) 100 MCG/2ML IJ SOLN
50.0000 ug | Freq: Once | INTRAMUSCULAR | Status: AC
  Administered 2018-04-12: 50 ug via INTRAVENOUS

## 2018-04-12 MED ORDER — ONDANSETRON HCL 4 MG/2ML IJ SOLN
INTRAMUSCULAR | Status: AC
  Filled 2018-04-12: qty 2

## 2018-04-12 MED ORDER — METOCLOPRAMIDE HCL 5 MG/ML IJ SOLN: 5.0000 mg | Freq: Three times a day (TID) | INTRAMUSCULAR | Status: DC | PRN

## 2018-04-12 MED ORDER — ONDANSETRON HCL 4 MG/2ML IJ SOLN: 4.0000 mg | Freq: Four times a day (QID) | INTRAMUSCULAR | Status: DC | PRN

## 2018-04-12 MED ORDER — PHENYLEPHRINE HCL 10 MG/ML IJ SOLN
INTRAMUSCULAR | Status: DC | PRN
  Administered 2018-04-12 (×5): 80 ug via INTRAVENOUS

## 2018-04-12 MED ORDER — PHENYLEPHRINE 40 MCG/ML (10ML) SYRINGE FOR IV PUSH (FOR BLOOD PRESSURE SUPPORT)
PREFILLED_SYRINGE | INTRAVENOUS | Status: AC
  Filled 2018-04-12: qty 10

## 2018-04-12 MED ORDER — MIDAZOLAM HCL 2 MG/2ML IJ SOLN
INTRAMUSCULAR | Status: AC
  Administered 2018-04-12: 1 mg via INTRAVENOUS
  Filled 2018-04-12: qty 2

## 2018-04-12 MED ORDER — OXYCODONE HCL 5 MG/5ML PO SOLN: 5.0000 mg | Freq: Once | ORAL | Status: DC | PRN

## 2018-04-12 MED ORDER — 0.9 % SODIUM CHLORIDE (POUR BTL) OPTIME
TOPICAL | Status: DC | PRN
  Administered 2018-04-12: 1000 mL

## 2018-04-12 MED ORDER — ONDANSETRON HCL 4 MG PO TABS: 4.0000 mg | ORAL_TABLET | Freq: Four times a day (QID) | ORAL | Status: DC | PRN

## 2018-04-12 MED ORDER — PROPOFOL 10 MG/ML IV BOLUS
INTRAVENOUS | Status: DC | PRN
  Administered 2018-04-12: 150 mg via INTRAVENOUS

## 2018-04-12 MED ORDER — EPHEDRINE SULFATE 50 MG/ML IJ SOLN
INTRAMUSCULAR | Status: DC | PRN
  Administered 2018-04-12: 5 mg via INTRAVENOUS
  Administered 2018-04-12: 10 mg via INTRAVENOUS
  Administered 2018-04-12: 5 mg via INTRAVENOUS
  Administered 2018-04-12: 10 mg via INTRAVENOUS

## 2018-04-12 MED ORDER — LACTATED RINGERS IV SOLN
Freq: Once | INTRAVENOUS | Status: AC
  Administered 2018-04-12: 10:00:00 via INTRAVENOUS

## 2018-04-12 MED ORDER — FENTANYL CITRATE (PF) 100 MCG/2ML IJ SOLN
INTRAMUSCULAR | Status: AC
  Administered 2018-04-12: 50 ug via INTRAVENOUS
  Filled 2018-04-12: qty 2

## 2018-04-12 MED ORDER — FENTANYL CITRATE (PF) 250 MCG/5ML IJ SOLN
INTRAMUSCULAR | Status: AC
  Filled 2018-04-12: qty 5

## 2018-04-12 MED ORDER — OXYCODONE HCL 5 MG PO TABS: 5.0000 mg | ORAL_TABLET | Freq: Once | ORAL | Status: DC | PRN

## 2018-04-12 MED ORDER — PROPOFOL 10 MG/ML IV BOLUS
INTRAVENOUS | Status: AC
  Filled 2018-04-12: qty 20

## 2018-04-12 MED ORDER — DEXAMETHASONE SODIUM PHOSPHATE 10 MG/ML IJ SOLN
INTRAMUSCULAR | Status: AC
  Filled 2018-04-12: qty 1

## 2018-04-12 MED ORDER — SODIUM CHLORIDE 0.9 % IV SOLN
INTRAVENOUS | Status: DC | PRN
  Administered 2018-04-12: 40 ug/min via INTRAVENOUS

## 2018-04-12 MED ORDER — LIDOCAINE 2% (20 MG/ML) 5 ML SYRINGE
INTRAMUSCULAR | Status: DC | PRN
  Administered 2018-04-12: 40 mg via INTRAVENOUS

## 2018-04-12 MED ORDER — CEFAZOLIN SODIUM-DEXTROSE 2-4 GM/100ML-% IV SOLN
2.0000 g | INTRAVENOUS | Status: AC
  Administered 2018-04-12: 2 g via INTRAVENOUS

## 2018-04-12 MED ORDER — METOCLOPRAMIDE HCL 5 MG PO TABS: 5.0000 mg | ORAL_TABLET | Freq: Three times a day (TID) | ORAL | Status: DC | PRN

## 2018-04-12 MED ORDER — DEXAMETHASONE SODIUM PHOSPHATE 10 MG/ML IJ SOLN
INTRAMUSCULAR | Status: DC | PRN
  Administered 2018-04-12: 10 mg via INTRAVENOUS

## 2018-04-12 MED ORDER — POVIDONE-IODINE 7.5 % EX SOLN
Freq: Once | CUTANEOUS | Status: DC
  Filled 2018-04-12: qty 118

## 2018-04-12 MED ORDER — EPHEDRINE 5 MG/ML INJ
INTRAVENOUS | Status: AC
  Filled 2018-04-12: qty 10

## 2018-04-12 SURGICAL SUPPLY — 73 items
ALCOHOL 70% 16 OZ (MISCELLANEOUS) ×2 IMPLANT
BANDAGE ESMARK 6X9 LF (GAUZE/BANDAGES/DRESSINGS) ×1 IMPLANT
BIT DRILL 2.5X110 QC LCP DISP (BIT) ×2 IMPLANT
BIT DRILL CANN 2.7X625 NONSTRL (BIT) ×2 IMPLANT
BIT DRILL LONG 2.7 (BIT) ×1 IMPLANT
BIT DRILL PERC QC 2.8X200 100 (BIT) ×1 IMPLANT
BIT DRILL QC 2X125 (BIT) ×1 IMPLANT
BLADE SURG 15 STRL LF DISP TIS (BLADE) ×1 IMPLANT
BLADE SURG 15 STRL SS (BLADE) ×1
BNDG CMPR 9X6 STRL LF SNTH (GAUZE/BANDAGES/DRESSINGS) ×1
BNDG COHESIVE 4X5 TAN STRL (GAUZE/BANDAGES/DRESSINGS) ×2 IMPLANT
BNDG COHESIVE 6X5 TAN STRL LF (GAUZE/BANDAGES/DRESSINGS) ×2 IMPLANT
BNDG ESMARK 6X9 LF (GAUZE/BANDAGES/DRESSINGS) ×2
CANISTER SUCT 3000ML PPV (MISCELLANEOUS) ×2 IMPLANT
CHLORAPREP W/TINT 26ML (MISCELLANEOUS) ×4 IMPLANT
COVER SURGICAL LIGHT HANDLE (MISCELLANEOUS) ×2 IMPLANT
COVER WAND RF STERILE (DRAPES) ×2 IMPLANT
CUFF TOURNIQUET SINGLE 34IN LL (TOURNIQUET CUFF) ×2 IMPLANT
CUFF TOURNIQUET SINGLE 44IN (TOURNIQUET CUFF) IMPLANT
DRAPE C-ARMOR (DRAPES) ×2 IMPLANT
DRAPE OEC MINIVIEW 54X84 (DRAPES) IMPLANT
DRAPE U-SHAPE 47X51 STRL (DRAPES) ×2 IMPLANT
DRILL BIT LONG 2.7 (BIT) ×2
DRILL BIT QC 2X125 (BIT) ×1
DRILL BIT QUICK COUP 2.8MM 100 (BIT) ×1
DRSG ADAPTIC 3X8 NADH LF (GAUZE/BANDAGES/DRESSINGS) ×2 IMPLANT
DRSG MEPITEL 4X7.2 (GAUZE/BANDAGES/DRESSINGS) ×2 IMPLANT
DRSG PAD ABDOMINAL 8X10 ST (GAUZE/BANDAGES/DRESSINGS) ×4 IMPLANT
ELECT REM PT RETURN 9FT ADLT (ELECTROSURGICAL) ×2
ELECTRODE REM PT RTRN 9FT ADLT (ELECTROSURGICAL) ×1 IMPLANT
GAUZE SPONGE 4X4 12PLY STRL (GAUZE/BANDAGES/DRESSINGS) ×2 IMPLANT
GLOVE BIO SURGEON STRL SZ7.5 (GLOVE) ×2 IMPLANT
GLOVE BIOGEL PI IND STRL 8 (GLOVE) ×1 IMPLANT
GLOVE BIOGEL PI INDICATOR 8 (GLOVE) ×1
GLOVE ECLIPSE 8.0 STRL XLNG CF (GLOVE) ×2 IMPLANT
GOWN STRL REUS W/ TWL LRG LVL3 (GOWN DISPOSABLE) ×1 IMPLANT
GOWN STRL REUS W/ TWL XL LVL3 (GOWN DISPOSABLE) ×2 IMPLANT
GOWN STRL REUS W/TWL LRG LVL3 (GOWN DISPOSABLE) ×1
GOWN STRL REUS W/TWL XL LVL3 (GOWN DISPOSABLE) ×2
K-WIRE 1.25 TRCR POINT 150 (WIRE) ×2
KIT BASIN OR (CUSTOM PROCEDURE TRAY) ×2 IMPLANT
KIT TURNOVER KIT B (KITS) ×2 IMPLANT
KWIRE 1.25 TRCR POINT 150 (WIRE) ×1 IMPLANT
NS IRRIG 1000ML POUR BTL (IV SOLUTION) ×2 IMPLANT
PACK ORTHO EXTREMITY (CUSTOM PROCEDURE TRAY) ×2 IMPLANT
PAD ARMBOARD 7.5X6 YLW CONV (MISCELLANEOUS) ×4 IMPLANT
PAD CAST 4YDX4 CTTN HI CHSV (CAST SUPPLIES) ×1 IMPLANT
PADDING CAST COTTON 4X4 STRL (CAST SUPPLIES) ×1
PADDING CAST SYNTHETIC 4 (CAST SUPPLIES) ×1
PADDING CAST SYNTHETIC 4X4 STR (CAST SUPPLIES) ×1 IMPLANT
PLATE LCP 3.5 1/3 TUB 6HX69 (Plate) ×2 IMPLANT
SCREW CANC FT ST SFS 4X14 (Screw) ×2 IMPLANT
SCREW CORTEX 2.7X16MM (Screw) ×2 IMPLANT
SCREW CORTEX 2.7X24MM (Screw) ×2 IMPLANT
SCREW CORTEX 3.5 14MM (Screw) ×1 IMPLANT
SCREW CORTEX 3.5 16MM (Screw) ×2 IMPLANT
SCREW LOCK CORT ST 3.5X14 (Screw) ×1 IMPLANT
SCREW LOCK CORT ST 3.5X16 (Screw) ×2 IMPLANT
SCREW LOCK T15 FT 16X3.5X2.9X (Screw) ×1 IMPLANT
SCREW LOCKING 3.5X16 (Screw) ×1 IMPLANT
SCREW SHORT THREAD 4.0X40 (Screw) ×4 IMPLANT
SPLINT FIBERGLASS 4X30 (CAST SUPPLIES) ×4 IMPLANT
SPONGE LAP 18X18 X RAY DECT (DISPOSABLE) ×2 IMPLANT
SUCTION FRAZIER HANDLE 10FR (MISCELLANEOUS) ×1
SUCTION FRAZIER TIP 10 FR DISP (SUCTIONS) ×2 IMPLANT
SUCTION TUBE FRAZIER 10FR DISP (MISCELLANEOUS) ×1 IMPLANT
SUT ETHILON 3 0 PS 1 (SUTURE) IMPLANT
SUT MNCRL AB 3-0 PS2 18 (SUTURE) IMPLANT
SUT VIC AB 2-0 CT1 27 (SUTURE) ×4
SUT VIC AB 2-0 CT1 TAPERPNT 27 (SUTURE) ×4 IMPLANT
TOWEL OR 17X24 6PK STRL BLUE (TOWEL DISPOSABLE) ×2 IMPLANT
TOWEL OR 17X26 10 PK STRL BLUE (TOWEL DISPOSABLE) ×2 IMPLANT
TUBE CONNECTING 12X1/4 (SUCTIONS) ×2 IMPLANT

## 2018-04-12 NOTE — Brief Op Note (Signed)
04/12/2018  12:41 PM  PATIENT:  Gina Norman  52 y.o. female  PRE-OPERATIVE DIAGNOSIS:  LEFT ANKLE BIMALLEOLAR FRACTURE  POST-OPERATIVE DIAGNOSIS:  LEFT ANKLE BIMALLEOLAR FRACTURE  PROCEDURE:  Procedure(s): OPEN REDUCTION INTERNAL FIXATION (ORIF) ANKLE FRACTURE (Left)  SURGEON:  Surgeon(s) and Role:    * Terance Hart, MD - Primary  PHYSICIAN ASSISTANT: none  ASSISTANTS: Darron Doom  ANESTHESIA:   general  EBL:  20 mL   BLOOD ADMINISTERED:none  DRAINS: none   LOCAL MEDICATIONS USED:  NONE  SPECIMEN:  No Specimen  DISPOSITION OF SPECIMEN:  N/A  COUNTS:  YES  TOURNIQUET:  * Missing tourniquet times found for documented tourniquets in log: [id number] *  DICTATION: .Dragon Dictation  PLAN OF CARE: Discharge to home after PACU  PATIENT DISPOSITION:  PACU - hemodynamically stable.   Delay start of Pharmacological VTE agent (>24hrs) due to surgical blood loss or risk of bleeding: not applicable

## 2018-04-12 NOTE — Transfer of Care (Signed)
Immediate Anesthesia Transfer of Care Note  Patient: Gina Norman  Procedure(s) Performed: OPEN REDUCTION INTERNAL FIXATION (ORIF) ANKLE FRACTURE (Left Ankle)  Patient Location: PACU  Anesthesia Type:General and Regional  Level of Consciousness: awake, alert , oriented and sedated  Airway & Oxygen Therapy: Patient Spontanous Breathing and Patient connected to nasal cannula oxygen  Post-op Assessment: Report given to RN, Post -op Vital signs reviewed and stable and Patient moving all extremities  Post vital signs: Reviewed and stable  Last Vitals:  Vitals Value Taken Time  BP 105/62 04/12/2018 12:58 PM  Temp    Pulse 86 04/12/2018 12:59 PM  Resp 17 04/12/2018 12:59 PM  SpO2 100 % 04/12/2018 12:59 PM  Vitals shown include unvalidated device data.  Last Pain:  Vitals:   04/12/18 0935  TempSrc:   PainSc: 3       Patients Stated Pain Goal: 3 (04/12/18 0935)  Complications: No apparent anesthesia complications

## 2018-04-12 NOTE — Anesthesia Procedure Notes (Addendum)
Anesthesia Regional Block: Adductor canal block   Pre-Anesthetic Checklist: ,, timeout performed, Correct Patient, Correct Site, Correct Laterality, Correct Procedure, Correct Position, site marked, Risks and benefits discussed, pre-op evaluation,  At surgeon's request and post-op pain management  Laterality: Left  Prep: Maximum Sterile Barrier Precautions used, chloraprep       Needles:  Injection technique: Single-shot  Needle Type: Echogenic Stimulator Needle     Needle Length: 9cm  Needle Gauge: 21     Additional Needles:   Procedures:,,,, ultrasound used (permanent image in chart),,,,  Narrative:  Start time: 04/12/2018 10:30 AM End time: 04/12/2018 10:40 AM Injection made incrementally with aspirations every 5 mL.  Performed by: Personally  Anesthesiologist: Kipp Brood, MD  Additional Notes: 20 cc 0.75% Ropivacaine injected easily

## 2018-04-12 NOTE — Anesthesia Preprocedure Evaluation (Addendum)
Anesthesia Evaluation  Patient identified by MRN, date of birth, ID band Patient awake    Reviewed: Allergy & Precautions, NPO status , Patient's Chart, lab work & pertinent test results  Airway Mallampati: II  TM Distance: >3 FB Neck ROM: Full    Dental  (+) Teeth Intact, Dental Advisory Given   Pulmonary former smoker,    breath sounds clear to auscultation       Cardiovascular hypertension,  Rhythm:Regular Rate:Normal     Neuro/Psych    GI/Hepatic   Endo/Other    Renal/GU      Musculoskeletal   Abdominal (+) + obese,   Peds  Hematology   Anesthesia Other Findings   Reproductive/Obstetrics                             Anesthesia Physical Anesthesia Plan  ASA: II  Anesthesia Plan: General   Post-op Pain Management:  Regional for Post-op pain   Induction: Intravenous  PONV Risk Score and Plan: 1 and Ondansetron and Dexamethasone  Airway Management Planned: LMA  Additional Equipment:   Intra-op Plan:   Post-operative Plan:   Informed Consent: I have reviewed the patients History and Physical, chart, labs and discussed the procedure including the risks, benefits and alternatives for the proposed anesthesia with the patient or authorized representative who has indicated his/her understanding and acceptance.   Dental advisory given  Plan Discussed with: CRNA and Anesthesiologist  Anesthesia Plan Comments:         Anesthesia Quick Evaluation

## 2018-04-12 NOTE — Anesthesia Postprocedure Evaluation (Signed)
Anesthesia Post Note  Patient: Gina Norman  Procedure(s) Performed: OPEN REDUCTION INTERNAL FIXATION (ORIF) ANKLE FRACTURE (Left Ankle)     Patient location during evaluation: PACU Anesthesia Type: General Level of consciousness: awake and alert Pain management: pain level controlled Vital Signs Assessment: post-procedure vital signs reviewed and stable Respiratory status: spontaneous breathing, nonlabored ventilation, respiratory function stable and patient connected to nasal cannula oxygen Cardiovascular status: blood pressure returned to baseline and stable Postop Assessment: no apparent nausea or vomiting Anesthetic complications: no    Last Vitals:  Vitals:   04/12/18 1345 04/12/18 1410  BP: 104/80 113/65  Pulse: 86 60  Resp: 16 16  Temp: 36.7 C   SpO2: 99% 95%    Last Pain:  Vitals:   04/12/18 1410  TempSrc:   PainSc: 0-No pain                 Valori Hollenkamp COKER

## 2018-04-12 NOTE — Anesthesia Procedure Notes (Signed)
Procedure Name: LMA Insertion Date/Time: 04/12/2018 11:08 AM Performed by: Fransisca Kaufmann, CRNA Pre-anesthesia Checklist: Patient identified, Emergency Drugs available, Suction available and Patient being monitored Patient Re-evaluated:Patient Re-evaluated prior to induction Oxygen Delivery Method: Circle System Utilized Preoxygenation: Pre-oxygenation with 100% oxygen Induction Type: IV induction Ventilation: Mask ventilation without difficulty LMA: LMA inserted LMA Size: 4.0 Number of attempts: 1 Airway Equipment and Method: Bite block Placement Confirmation: positive ETCO2 Tube secured with: Tape Dental Injury: Teeth and Oropharynx as per pre-operative assessment

## 2018-04-12 NOTE — H&P (Signed)
Gina Norman is an 52 y.o. female.   Chief Complaint: Left ankle fracture HPI: She is a 52 year old female who fell on the stairs on Saturday.  She was diagnosed with a bimalleolar ankle fracture at the ER placed in splint.  She is here for definitive surgical treatment.  She is been having some pain.  She feels like the splint is loose as the swelling is gone down.  She denies any other joint or extremity pain.  Past Medical History:  Diagnosis Date  . ADD (attention deficit disorder)   . Back pain   . Complication of anesthesia    hard to wake up  . DDD (degenerative disc disease), lumbosacral   . Hypertension   . Wears contact lenses     Past Surgical History:  Procedure Laterality Date  . CARPAL TUNNEL RELEASE Left 08/14/2014   Procedure: LEFT CARPAL TUNNEL RELEASE;  Surgeon: Daryll Brod, MD;  Location: [facility];  Service: Orthopedics;  Laterality: Left;  IV [facility] FAB  . CARPAL TUNNEL RELEASE Right 09/23/2014   Procedure: RIGHT CARPAL TUNNEL RELEASE;  Surgeon: Daryll Brod, MD;  Location: [facility];  Service: Orthopedics;  Laterality: Right;  . TONSILLECTOMY      History reviewed. No pertinent family history. Social History:  reports that she quit smoking about 9 years ago. She has never used smokeless tobacco. She reports that she drinks alcohol. She reports that she does not use drugs.  Allergies:  Allergies  Allergen Reactions  . Erythromycin Nausea Only    Upset stomach     Medications Prior to Admission  Medication Sig Dispense Refill  . amphetamine-dextroamphetamine (ADDERALL) 15 MG tablet Take 15 mg by mouth every morning.   0  . cetirizine (ZYRTEC) 10 MG tablet Take 10 mg by mouth daily.    . cyclobenzaprine (FLEXERIL) 5 MG tablet Take 5 mg by mouth at bedtime.   2  . etodolac (LODINE) 400 MG tablet Take 400 mg by mouth 2 (two) times daily.   12  . gabapentin (NEURONTIN) 300 MG capsule Take 300-600 mg by mouth See admin instructions.  300 mg in the morning, 600 mg at bedtime    . lisinopril-hydrochlorothiazide (PRINZIDE,ZESTORETIC) 10-12.5 MG per tablet Take 1 tablet by mouth daily.    . metoprolol succinate (TOPROL-XL) 50 MG 24 hr tablet Take 50 mg by mouth daily.  5  . montelukast (SINGULAIR) 10 MG tablet Take 10 mg by mouth at bedtime.   1  . oxyCODONE-acetaminophen (PERCOCET) 5-325 MG tablet Take 2 tablets by mouth every 6 (six) hours as needed for severe pain. 20 tablet 0  . Probiotic Product (PROBIOTIC DAILY PO) Take 1 capsule by mouth at bedtime.    . Turmeric 450 MG CAPS Take 1 capsule by mouth daily.      Results for orders placed or performed during the hospital encounter of 04/12/18 (from the past 48 hour(s))  Basic metabolic panel     Status: Abnormal   Collection Time: 04/12/18  9:34 AM  Result Value Ref Range   Sodium 138 135 - 145 mmol/L   Potassium 4.0 3.5 - 5.1 mmol/L   Chloride 103 98 - 111 mmol/L   CO2 22 22 - 32 mmol/L   Glucose, Bld 100 (H) 70 - 99 mg/dL   BUN 13 6 - 20 mg/dL   Creatinine, Ser 0.83 0.44 - 1.00 mg/dL   Calcium 9.3 8.9 - 10.3 mg/dL   GFR calc non Af Amer >60 >  60 mL/min   GFR calc Af Amer >60 >60 mL/min    Comment: (NOTE) The eGFR has been calculated using the CKD EPI equation. This calculation has not been validated in all clinical situations. eGFR's persistently <60 mL/min signify possible Chronic Kidney Disease.    Anion gap 13 5 - 15    Comment: Performed at [facility], [address] [place], [place] [address]  CBC     Status: None   Collection Time: 04/12/18  9:34 AM  Result Value Ref Range   WBC 6.7 4.0 - 10.5 K/uL   RBC 4.09 3.87 - 5.11 MIL/uL   Hemoglobin 12.8 12.0 - 15.0 g/dL   HCT 40.3 36.0 - 46.0 %   MCV 98.5 78.0 - 100.0 fL   MCH 31.3 26.0 - 34.0 pg   MCHC 31.8 30.0 - 36.0 g/dL   RDW 11.9 11.5 - 15.5 %   Platelets 165 150 - 400 K/uL    Comment: Performed at [facility], [address] [place], [place] [address]   No results  found.  Review of Systems  Constitutional: Negative.   HENT: Negative.   Respiratory: Negative.   Cardiovascular: Negative.   Gastrointestinal: Negative.   Musculoskeletal:       Left ankle pain  Skin: Negative.   Neurological: Negative.   Psychiatric/Behavioral: Negative.     Blood pressure 121/68, pulse 86, temperature 98.4 F (36.9 C), temperature source Oral, resp. rate 18, height 5' 4" (1.626 m), weight 93 kg, last menstrual period 09/03/2014, SpO2 97 %. Physical Exam  Constitutional: She appears well-developed and well-nourished.  HENT:  Head: Normocephalic.  Eyes: Conjunctivae are normal.  Neck: Neck supple.  Cardiovascular: Normal rate.  Respiratory: Effort normal.  GI: Soft.  Musculoskeletal:  Left lower extremity in splint.  Toes exposed warm well perfused.  Endorses sensation over toes.  No tenderness with palpation proximal to splint.  No tenderness palpation of the knee.  Skin: Skin is warm.  Psychiatric: She has a normal mood and affect. Her behavior is normal.     Assessment/Plan We will plan with surgical intervention.  This will be done under LMA anesthesia and popliteal and saphenous block.  The risks, benefits and alternatives of surgery were discussed with her and she wishes to proceed.   R , MD 04/12/2018, 10:52 AM   

## 2018-04-12 NOTE — Discharge Instructions (Signed)
DR. Susa Simmonds FOOT [facility] ANKLE SURGERY POST-OP INSTRUCTIONS   Pain Management 1. The numbing medicine and your leg will last around 18 hours, take a dose of your pain medicine as soon as you feel it wearing off to avoid rebound pain. 2. Keep your foot elevated above heart level.  Make sure that your heel hangs free ('floats'). 3. Take all prescribed medication as directed. 4. If taking narcotic pain medication you may want to use an over-the-counter stool softener to avoid constipation. 5. You may take over-the-counter NSAIDs (ibuprofen, naproxen, etc.)  as directed on the packaging as a supplement for your pain and may also use it to wean away from the prescription medication.  Activity  ? Non-weightbearing - keep dry    First Postoperative Visit 1. Your first postop visit will be at least 2 weeks after surgery.  This should be scheduled when you schedule surgery. 2. If you do not have a postoperative visit scheduled please call [phone] to schedule an appointment. 3. At the appointment your incision will be evaluated for suture removal, x-rays will be obtained if necessary.  General Instructions 1. Swelling is very common after foot and ankle surgery.  It often takes 3 months for the foot and ankle to begin to feel comfortable.  Some amount of swelling will persist for 6-12 months. 2. DO NOT change the dressing.  If there is a problem with the dressing (too tight, loose, gets wet, etc.) please contact Dr. Donnie Mesa office. 3. DO NOT get the dressing wet.  For showers you can use an over-the-counter cast cover or wrap a washcloth around the top of your dressing and then cover it with a plastic bag and tape it to your leg. 4. DO NOT soak the incision (no tubs, pools, bath, etc.) until you have approval from Dr. Susa Simmonds.  Contact Dr. Garret Reddish office or go to Emergency Room if: 1. Temperature above 101 F. 2. Increasing pain that is unresponsive to pain medication or elevation 3. Excessive redness  or swelling in your foot 4. Dressing problems - excessive bloody drainage, looseness or tightness, or if dressing gets wet 5. Develop pain, swelling, warmth, or discoloration of your calf

## 2018-04-12 NOTE — Addendum Note (Signed)
Addendum  created 04/12/18 2116 by Kipp Brood, MD   Child order released for a procedure order, Intraprocedure Blocks edited, Sign clinical note

## 2018-04-12 NOTE — Anesthesia Procedure Notes (Addendum)
Anesthesia Regional Block: Popliteal block   Pre-Anesthetic Checklist: ,, timeout performed, Correct Patient, Correct Site, Correct Laterality, Correct Procedure, Correct Position, site marked, Risks and benefits discussed,  Surgical consent,  Pre-op evaluation,  At surgeon's request and post-op pain management  Laterality: Left  Prep: chloraprep       Needles:  Injection technique: Single-shot  Needle Type: Stimulator Needle - 40      Needle Gauge: 22     Additional Needles:   Procedures:, nerve stimulator,,,,,,,  Narrative:  Start time: 04/12/2018 10:30 AM End time: 04/12/2018 10:40 AM Injection made incrementally with aspirations every 5 mL.  Performed by: Personally   Additional Notes: 20 cc 0.5% Bupivacaine with  1:200 epi injected easily

## 2018-04-13 NOTE — Addendum Note (Signed)
Addendum  created 04/13/18 1033 by Kipp Brood, MD   Intraprocedure Blocks edited, Sign clinical note

## 2018-04-16 NOTE — Op Note (Signed)
Gina Norman female 52 y.o. 04/16/2018  PreOperative Diagnosis: Left bimalleolar ankle fracture  PostOperative Diagnosis: Same   Procedure(s) and Anesthesia Type:    * OPEN REDUCTION INTERNAL FIXATION (ORIF) BIMALLEOLAR ANKLE FRACTURE - General  Surgeon: Terance Hart   Assistants: NONE  Anesthesia: General LMA anesthesia   Findings: Lateral malleolus fracture Medial malleolus fracture  Implants: Synthes locking one third tubular plate, 4.0 mm cannulated screws  Indications:52 y.o. female who had a fall and sustained a left bimalleolar ankle fracture.  This was unstable.  She was indicated for open treatment of this fracture.  After discussion of the risks, benefits and alternatives of the surgery she wished to proceed.  The risks discussed were not limited to wound healing complications, infection, nonunion or malunion, need for second surgery, less than optimal outcome, damage to surrounding structures.  Procedure Detail: Patient was identified in the preoperative holding area.  The left lower extremity was identified as the appropriate operative extremity and marked by myself and the patient.  The consent was also signed by myself and the patient.  Anesthesia performed a peripheral nerve block.  She was taken to the operating room and placed supine on the operative table.  Anesthesia was induced without difficulty.  All bony prominences were well-padded.  A bump was placed under the left hip.  A thigh tourniquet was placed.  The left lower extremity was prepped and draped in the usual sterile fashion.  A surgical pause was performed identifying the left lower extremity and the appropriate surgical procedure to be performed.  I began by making a 4 cm incision over the distal fibular fracture.  This was centered over the fibula.  This was taken down sharply through skin and subcutaneous tissue.  Blunt dissection was used to identify the superficial peroneal nerve which was not  in the surgical field.  Then sharply the periosteum was incised longitudinally over the fracture site and the fracture site was opened and cleaned out using a curette and a rondure.  Then using a lobster claw clamp I was able to reduce the fibula fracture and confirm acceptable reduction on intraoperative fluoroscopy.  Then a 2.7 mm lag screw was placed across the fracture site and a lag by technique fashion.  Then a locking one third tubular plate was placed.  I confirmed appropriate screw length on intraoperative fluoroscopy.  Then I turned my attention to the medial side and a 3 cm incision was created over the medial malleolus fracture.  This was taken down through skin and subcutaneous tissue.  I mobilized and retracted the saphenous vein.  Then the fracture site was identified and the periosteum was sharply incised with a 15 blade over lying the fracture site.  The fracture was cleaned out of periosteum and cartilaginous debris as well as hematoma.  I was able to book open the fracture site and look at the medial dome of the talus.  There was a small osteochondral defect there this was curetted out of the loose cartilaginous fragment.  The ankle joint was cleaned of hematoma and irrigated with normal saline.  Then the medial malleolus fragment was reduced with a pointed reduction clamp and checked on fluoroscopy to confirm appropriate reduction.  I then placed K wires 4 to 4.0 mm cannulated screws and again confirmed appropriate position and placed the screws without difficulty.  I then checked a stress x-ray of the ankle and found it to be stable.  The wounds were both irrigated with normal  saline.  The deep tissues was closed with 2-0 PDS.  The tourniquet was let down at this time.  The subcutaneous tissue with 3-0 Monocryl in the skin with 3-0 nylon.   Xeroform was placed over the wounds and then 4 x 4's and she cotton.  S short leg splint was placed.   At the end of the case all counts were correct.   There were no complications.  Post Op Instructions: Patient will remain nonweightbearing on the left lower extremity. Keep splint dry. Take 1 325 mg aspirin for DVT prophylaxis. Return to clinic in 2 weeks for postoperative visit. When the patient returns to clinic we will remove the splint and get x-rays on arrival.  We will check the skin and possibly remove the sutures.  At that point she will go into a cast.  Tourniquette Time: 60 minutes  Estimated Blood Loss:  Minimal         Drains: none  Blood Given: none         Specimens: none       Complications:  * No complications entered in OR log *         Disposition: PACU - hemodynamically stable.         Condition: stable

## 2018-04-17 ENCOUNTER — Encounter (HOSPITAL_COMMUNITY): Payer: Self-pay | Admitting: Orthopaedic Surgery

## 2018-04-23 DIAGNOSIS — E669 Obesity, unspecified: Secondary | ICD-10-CM | POA: Diagnosis not present

## 2018-04-23 DIAGNOSIS — E782 Mixed hyperlipidemia: Secondary | ICD-10-CM | POA: Diagnosis not present

## 2018-04-23 DIAGNOSIS — I10 Essential (primary) hypertension: Secondary | ICD-10-CM | POA: Diagnosis not present

## 2018-04-23 DIAGNOSIS — F900 Attention-deficit hyperactivity disorder, predominantly inattentive type: Secondary | ICD-10-CM | POA: Diagnosis not present

## 2018-04-23 DIAGNOSIS — Z23 Encounter for immunization: Secondary | ICD-10-CM | POA: Diagnosis not present

## 2018-04-26 DIAGNOSIS — S82842D Displaced bimalleolar fracture of left lower leg, subsequent encounter for closed fracture with routine healing: Secondary | ICD-10-CM | POA: Diagnosis not present

## 2018-05-03 NOTE — Addendum Note (Signed)
Addendum  created 05/03/18 1533 by Kipp Brood, MD   Intraprocedure Blocks edited, Sign clinical note

## 2018-05-24 DIAGNOSIS — S82842D Displaced bimalleolar fracture of left lower leg, subsequent encounter for closed fracture with routine healing: Secondary | ICD-10-CM | POA: Diagnosis not present

## 2018-06-14 DIAGNOSIS — S82842D Displaced bimalleolar fracture of left lower leg, subsequent encounter for closed fracture with routine healing: Secondary | ICD-10-CM | POA: Diagnosis not present

## 2018-06-22 DIAGNOSIS — S82842D Displaced bimalleolar fracture of left lower leg, subsequent encounter for closed fracture with routine healing: Secondary | ICD-10-CM | POA: Diagnosis not present

## 2018-08-07 DIAGNOSIS — L600 Ingrowing nail: Secondary | ICD-10-CM | POA: Diagnosis not present

## 2018-08-22 DIAGNOSIS — S82842D Displaced bimalleolar fracture of left lower leg, subsequent encounter for closed fracture with routine healing: Secondary | ICD-10-CM | POA: Diagnosis not present

## 2018-08-29 DIAGNOSIS — Z1231 Encounter for screening mammogram for malignant neoplasm of breast: Secondary | ICD-10-CM | POA: Diagnosis not present

## 2018-08-29 DIAGNOSIS — Z6836 Body mass index (BMI) 36.0-36.9, adult: Secondary | ICD-10-CM | POA: Diagnosis not present

## 2018-08-29 DIAGNOSIS — Z124 Encounter for screening for malignant neoplasm of cervix: Secondary | ICD-10-CM | POA: Diagnosis not present

## 2018-08-29 DIAGNOSIS — Z01419 Encounter for gynecological examination (general) (routine) without abnormal findings: Secondary | ICD-10-CM | POA: Diagnosis not present

## 2018-12-22 IMAGING — MR MR LUMBAR SPINE W/O CM
4 of 5 series · 18 of 48 positions shown · non-contrast
Comparison: Plain films 08/05/2015.

CLINICAL DATA: Low back and RIGHT hip pain.  No known injury.

EXAM:
MRI LUMBAR SPINE WITHOUT CONTRAST
TECHNIQUE: Multiplanar, multisequence MR imaging of the lumbar spine was
performed. No intravenous contrast was administered.

[Series 6: T2 · sagittal · 4.0mm · 0.73mm/px · 6 of 15 slices shown (1 of 2)]
[im 1/15]
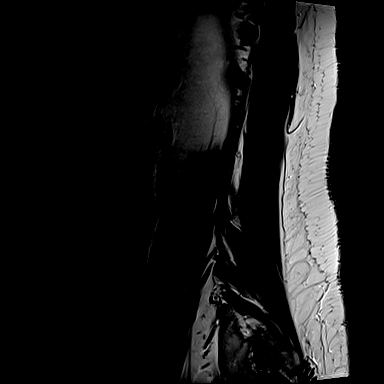
[im 3/15]
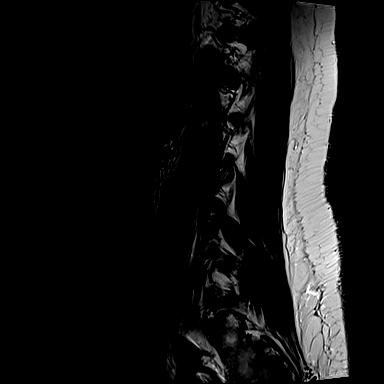
[im 6/15]
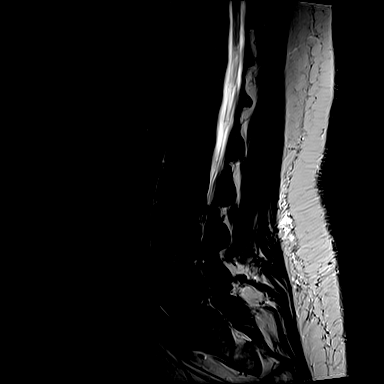
[im 9/15]
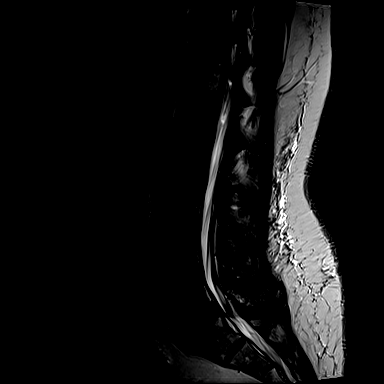
[im 12/15]
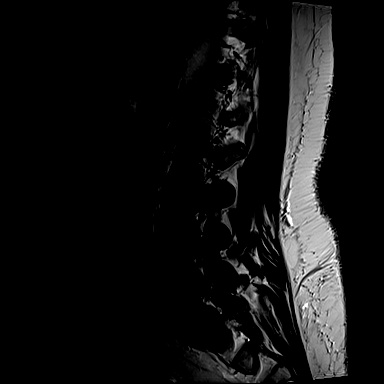
[im 15/15]
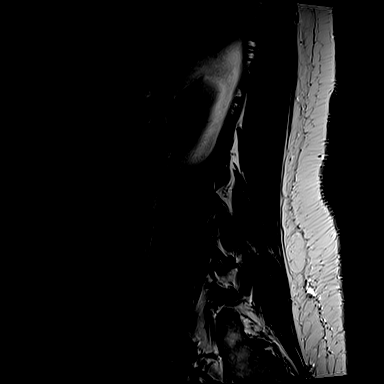

[Series 7: T1 · sagittal · 4.0mm · 0.73mm/px · 3 of 15 slices shown (1 of 2)]
[im 3/15]
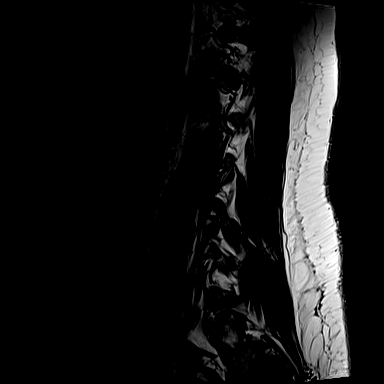
[im 9/15]
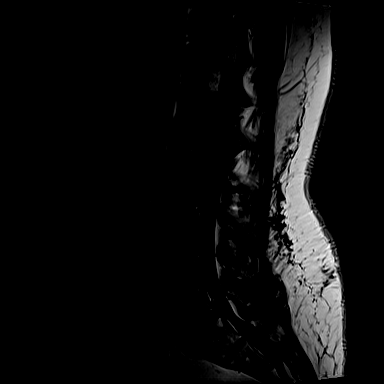
[im 15/15]
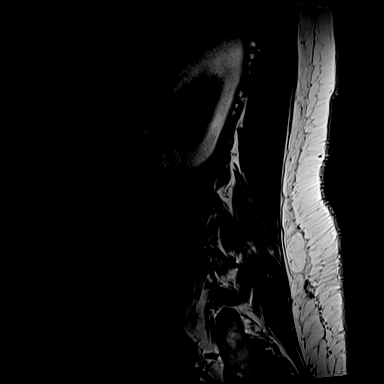

[Series 11: T2 · axial · 4.0mm · 0.28mm/px · z∈[+15,+189]mm · 6 of 41 slices shown (2 of 2)]
[im 1/41]
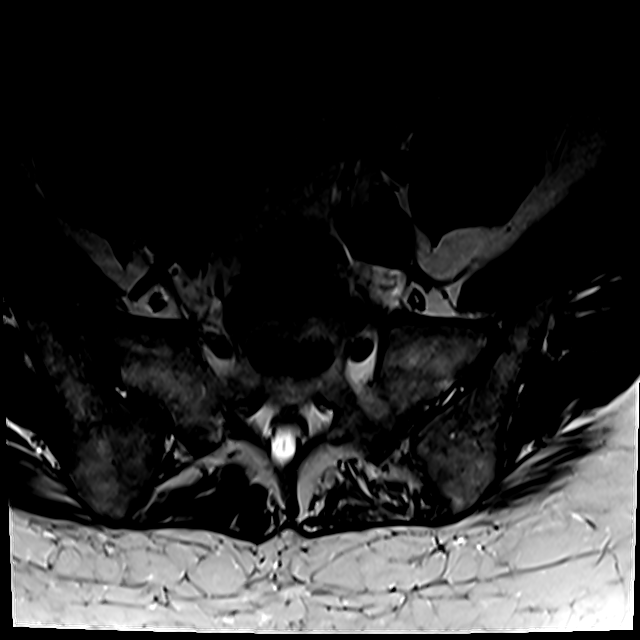
[im 6/41]
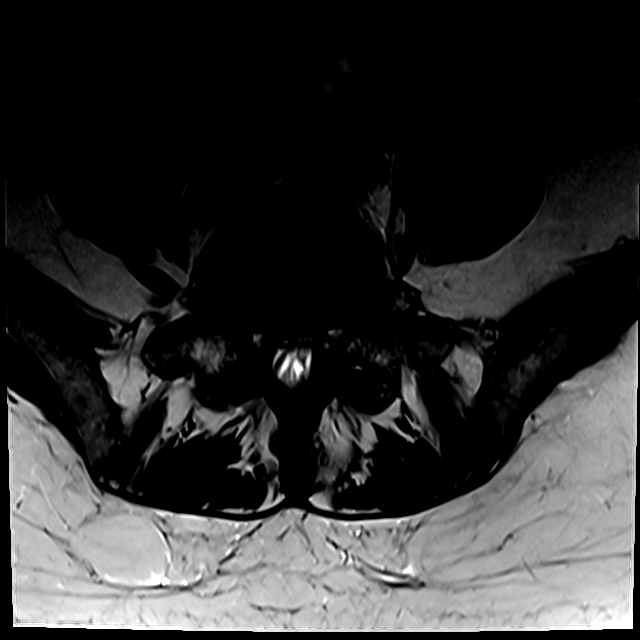
[im 12/41]
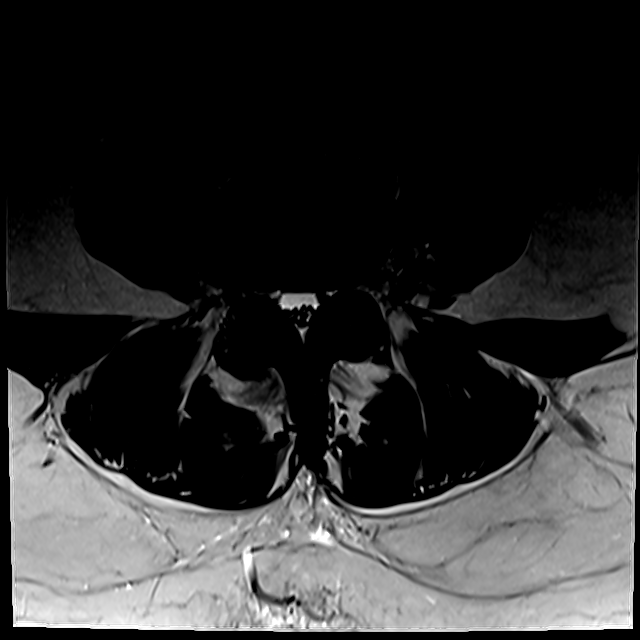
[im 18/41]
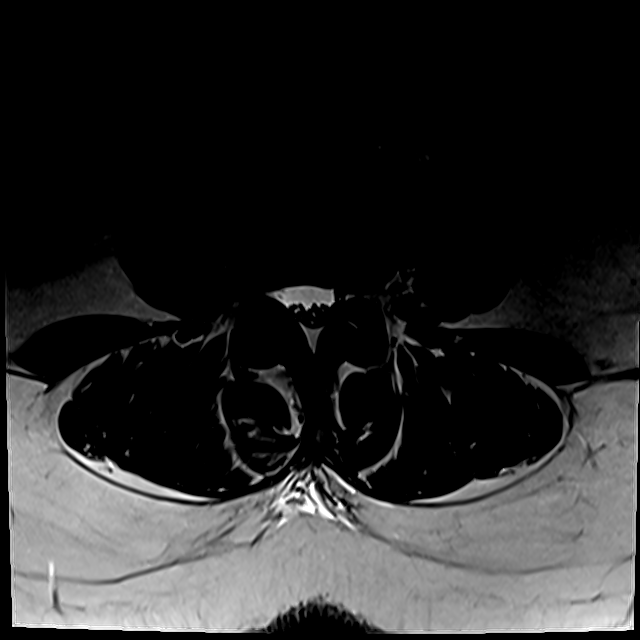
[im 21/41]
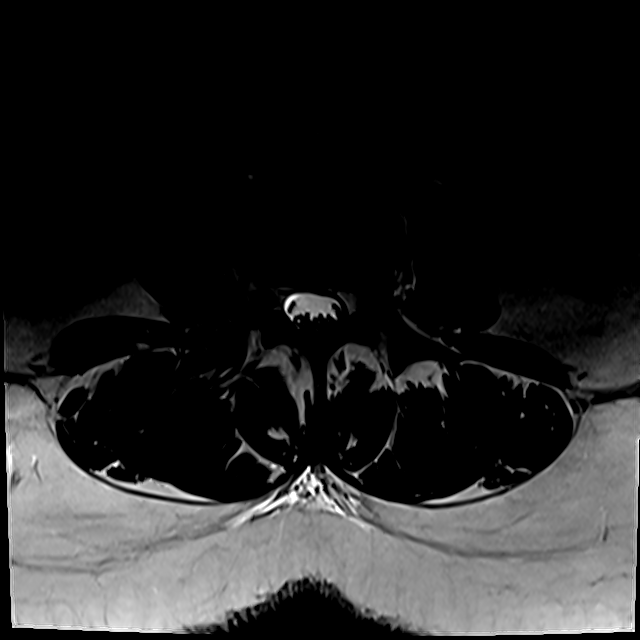
[im 35/41]
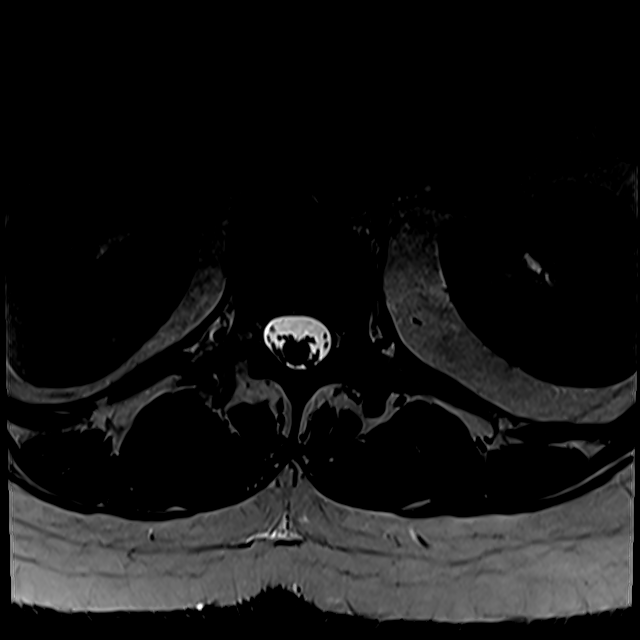

[Series 100: T1 · axial · 4.0mm · 0.28mm/px · z∈[+37,+189]mm · 3 of 41 slices shown (2 of 2)]
[im 6/41]
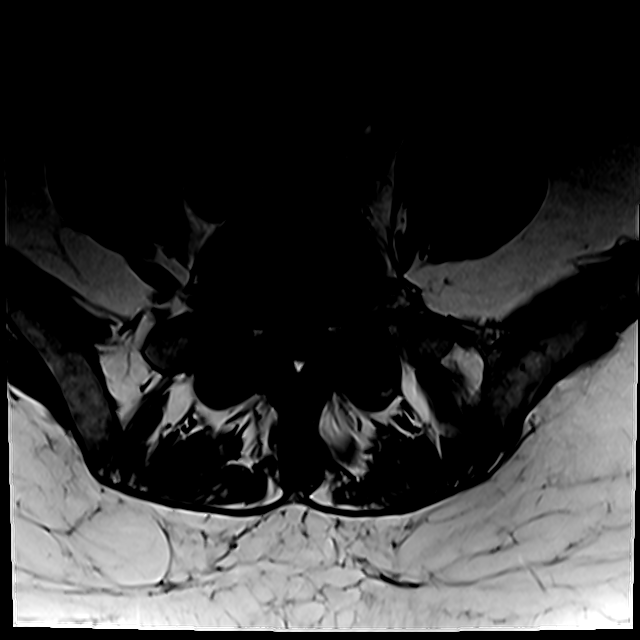
[im 21/41]
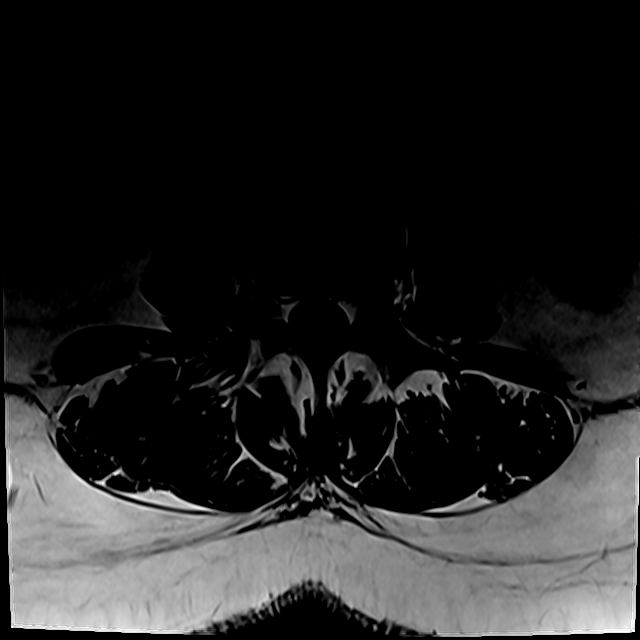
[im 35/41]
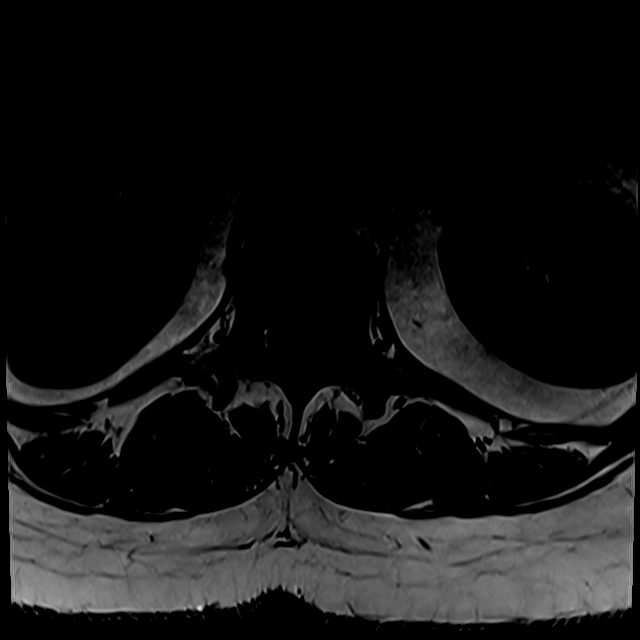

[18 of 48 positions shown; findings below may reference images not displayed]

FINDINGS: Segmentation:  Standard

Alignment: Physiologic except for 2 mm of facet mediated slip at
L5-S1.

Vertebrae:  No worrisome osseous lesion.   Large hemangioma L4.

Conus medullaris: Extends to the L2 level, slightly low. Otherwise
normal.

Paraspinal and other soft tissues: Unremarkable.

Disc levels:

L1-L2:  Normal.

L2-L3:  Normal disc space.  Mild facet arthropathy.  No impingement.

L3-L4:  Normal disc space.  Mild facet arthropathy.  No impingement.

L4-L5: Normal disc space. Moderate facet arthropathy and ligamentum
flavum hypertrophy. Short pedicles. Mild stenosis. No definite
impingement.

L5-S1: 2 mm anterolisthesis. Central protrusion. Posterior element
hypertrophy. Moderate to severe stenosis. BILATERAL L5 and S1 nerve
root impingement.

Compared with prior plain films, there is now 2 mm of slip L5-S1.
IMPRESSION: The dominant abnormality is at L5-S1 where 2 mm of slip, posterior
element hypertrophy, and central protrusion contribute to moderate
to severe stenosis as well as BILATERAL L5 and S1 nerve root
impingement.

Posterior element hypertrophy at L4-5, with mild stenosis but no
definite foraminal zone or subarticular zone narrowing.

## 2019-05-21 ENCOUNTER — Other Ambulatory Visit: Payer: Self-pay

## 2019-05-21 DIAGNOSIS — Z20822 Contact with and (suspected) exposure to covid-19: Secondary | ICD-10-CM

## 2019-05-23 LAB — NOVEL CORONAVIRUS, NAA: SARS-CoV-2, NAA: NOT DETECTED

## 2019-08-10 ENCOUNTER — Encounter: Payer: Self-pay | Admitting: Internal Medicine

## 2019-09-17 ENCOUNTER — Encounter: Payer: Self-pay | Admitting: Internal Medicine

## 2019-09-17 ENCOUNTER — Ambulatory Visit: Payer: Self-pay | Admitting: Internal Medicine

## 2019-09-17 ENCOUNTER — Other Ambulatory Visit: Payer: Self-pay

## 2019-09-17 VITALS — BP 110/68 | HR 102 | Temp 98.5°F | Ht 63.0 in | Wt 219.4 lb

## 2019-09-17 DIAGNOSIS — R7301 Impaired fasting glucose: Secondary | ICD-10-CM | POA: Insufficient documentation

## 2019-09-17 DIAGNOSIS — E781 Pure hyperglyceridemia: Secondary | ICD-10-CM | POA: Insufficient documentation

## 2019-09-17 DIAGNOSIS — Z6838 Body mass index (BMI) 38.0-38.9, adult: Secondary | ICD-10-CM

## 2019-09-17 DIAGNOSIS — E282 Polycystic ovarian syndrome: Secondary | ICD-10-CM | POA: Insufficient documentation

## 2019-09-17 MED ORDER — METFORMIN HCL ER 500 MG PO TB24: 500.0000 mg | ORAL_TABLET | Freq: Two times a day (BID) | ORAL | 1 refills | Status: DC

## 2019-09-17 NOTE — Patient Instructions (Signed)
-   Start Metformin 500 mg, 1 tablet with Breakfast for 1 week, if no nausea/vomiting or diarrhea , increase to 1 tablet with breakfast and 1 tablet with supper  - Continue Ezetimibe 10 mg daily  - Add Fish oil 1200 mg, 2 capsules daily

## 2019-09-17 NOTE — Progress Notes (Signed)
Name: Gina Norman  MRN/ DOB: [id number], 1966-04-14    Age/ Sex: 54 y.o., female    PCP: Clovis Riley, L.August Saucer, MD   Reason for Endocrinology Evaluation: PCOS      Date of Initial Endocrinology Evaluation: 09/18/2019     HPI: Gina Norman is a 54 y.o. female with a past medical history of PCOS, dyslipidemia, and obesity. The patient presented for initial endocrinology clinic visit on 09/18/2019 for consultative assistance with her PCOS.     Gina Norman has been diagnosed with PCOS over 20 years ago, historically she has had irregular menstruations requiring OCPs.  She has spent most of her life on OCPs, until 2017 she got scared of them and had to stop them as her aunt was diagnosed with thromboembolic event.  she did have to see an infertility specialist while trying to conceive, requiring hysteroscopies.  She has 2 sons the youngest was born in 2010.  She had gestational diabetes during her pregnancies.  She has not had any menstruation since 2017    Today she is mainly concerned about weight gain, she did join the new program and was able to lose weight initially but her weight has plateaued and was disappointed and discouraged and had stopped.  She has gained weight since stopping the program.  Patient has hypertriglyceridemia with TG levels  > 400 mg/dL in 10/4008.  She was started on ezetimibe at the time with repeat TG and 1/21 at 350 mg/DL.  Patient does have fish oil but she forgets to take it. Patient lost insurance a month ago  Patient denies hirsutism, she does have a few dark hairs on the chin that she is able to plug when needed.   HISTORY:  Past Medical History:  Past Medical History:  Diagnosis Date  . ADD (attention deficit disorder)   . Back pain   . Complication of anesthesia    hard to wake up  . DDD (degenerative disc disease), lumbosacral   . Hypertension   . Wears contact lenses    Past Surgical History:  Past Surgical History:  Procedure  Laterality Date  . CARPAL TUNNEL RELEASE Left 08/14/2014   Procedure: LEFT CARPAL TUNNEL RELEASE;  Surgeon: Cindee Salt, MD;  Location: [facility];  Service: Orthopedics;  Laterality: Left;  IV REGIONAL FAB  . CARPAL TUNNEL RELEASE Right 09/23/2014   Procedure: RIGHT CARPAL TUNNEL RELEASE;  Surgeon: Cindee Salt, MD;  Location: [facility];  Service: Orthopedics;  Laterality: Right;  . ORIF ANKLE FRACTURE Left 04/12/2018   Procedure: OPEN REDUCTION INTERNAL FIXATION (ORIF) ANKLE FRACTURE;  Surgeon: Terance Hart, MD;  [facility];  Service: Orthopedics;  Laterality: Left;  . TONSILLECTOMY        Social History:  reports that she quit smoking about 11 years ago. She has never used smokeless tobacco. She reports current alcohol use. She reports that she does not use drugs.  Family History: family history is not on file.   HOME MEDICATIONS: Allergies as of 09/17/2019      Reactions   Erythromycin Nausea Only   Upset stomach       Medication List       Accurate as of September 17, 2019 11:59 PM. If you have any questions, ask your nurse or doctor.        amphetamine-dextroamphetamine 15 MG tablet Commonly known as: ADDERALL Take 15 mg by mouth every morning.   cetirizine 10 MG tablet Commonly known  as: ZYRTEC Take 10 mg by mouth daily.   cyclobenzaprine 5 MG tablet Commonly known as: FLEXERIL Take 5 mg by mouth at bedtime.   etodolac 400 MG tablet Commonly known as: LODINE Take 400 mg by mouth 2 (two) times daily.   ezetimibe 10 MG tablet Commonly known as: ZETIA Take 10 mg by mouth daily.   gabapentin 300 MG capsule Commonly known as: NEURONTIN Take 300-600 mg by mouth See admin instructions. 300 mg in the morning, 600 mg at bedtime   lisinopril-hydrochlorothiazide 10-12.5 MG tablet Commonly known as: ZESTORETIC Take 1 tablet by mouth daily.   Melatonin 2.5 MG Caps Take by mouth.   metFORMIN 500 MG 24 hr tablet Commonly known as:  Glucophage XR Take 1 tablet (500 mg total) by mouth in the morning and at bedtime. Started by: Scarlette Shorts, MD   metoprolol succinate 50 MG 24 hr tablet Commonly known as: TOPROL-XL Take 50 mg by mouth daily.   montelukast 10 MG tablet Commonly known as: SINGULAIR Take 10 mg by mouth at bedtime.   oxyCODONE-acetaminophen 5-325 MG tablet Commonly known as: Percocet Take 2 tablets by mouth every 6 (six) hours as needed for severe pain.   PROBIOTIC DAILY PO Take 1 capsule by mouth at bedtime.   Turmeric 450 MG Caps Take 1 capsule by mouth daily.         REVIEW OF SYSTEMS: A comprehensive ROS was conducted with the patient and is negative except as per HPI and below:  ROS     OBJECTIVE:  VS: BP 110/68 (BP Location: Left Arm, Patient Position: Sitting, Cuff Size: Large)   Pulse (!) 102   Temp 98.5 F (36.9 C)   Ht 5\' 3"  (1.6 m)   Wt 219 lb 6.4 oz (99.5 kg)   LMP 09/03/2014   SpO2 98%   BMI 38.86 kg/m    Wt Readings from Last 3 Encounters:  09/17/19 219 lb 6.4 oz (99.5 kg)  04/12/18 205 lb (93 kg)  04/06/18 205 lb (93 kg)     EXAM: General: Pt appears well and is in NAD  Neck: General: Supple without adenopathy. Thyroid: Thyroid size normal.  No goiter or nodules appreciated. No thyroid bruit.  Lungs: Clear with good BS bilat with no rales, rhonchi, or wheezes  Heart: Auscultation: RRR.  Abdomen: Normoactive bowel sounds, soft, nontender, without masses or organomegaly palpable  Extremities:  BL LE: No pretibial edema normal ROM and strength.  Skin: Hair: Texture and amount normal with gender appropriate distribution Skin Inspection: No rashes Skin Palpation: Skin temperature, texture, and thickness normal to palpation  Neuro: Cranial nerves: II - XII grossly intact  Motor: Normal strength throughout DTRs: 2+ and symmetric in UE without delay in relaxation phase  Mental Status: Judgment, insight: Intact Orientation: Oriented to time, place, and  person Memory: Intact for recent and remote events Mood and affect: No depression, anxiety, or agitation     DATA REVIEWED:  07/2019 TG 350 mg/dL LDL 08/2019 mg/dL  Fasting glucose 409 mg/dL   ASSESSMENT/PLAN/RECOMMENDATIONS:   1. PCOS:  - After menopause the two key diagnostic criteria of PCOS (irregular menses and polycystic ovaries on transvaginal ultrasound) are no longer applicable, because menopausal women have amenorrhea.  -The main issue with PCOS in the postmenopausal years, is hyperandrogenism , which she has no symptoms or sign of at this time. -Postmenopausal woman with PCOS are at a higher risk for developing type 2 diabetes, cardiovascular disease, and other metabolic disorders.  Our focus moving forward should be on her metabolic syndromes.    2.  Impaired fasting glucose:  -Patient is at high risk for developing type 2 diabetes given her impaired fasting glucose, and history of gestational diabetes in the past. -We did discuss the importance of lifestyle changes -I also have offered her metformin to improve her insulin resistance, she has tried it in the past without any side effects -We did discuss GI side effects with Metformin   Medications : Metformin 500 mg XR, 1 tab twice daily-titration instructions were provided   3.  Hypertriglyceridemia:  -We did discuss the cardiovascular benefits of statins and that would be my first preference and started her on lipid-lowering agent.  She recalls being on a statin in the past, and developing right shoulder pains, she is not sure if the shoulder pains were related to her statins at the time.  She is willing to give this a try in the future. -I have encouraged her to check the [facility] $4 list for statin options, in the meantime she already has a supply of ezetimibe that she will continue on, she was also advised to start fish oil.  Medications  Ezetimibe 10 mg daily  Fish oil 1200 mg BID     4. BMI 38.86:  -We  again discussed the importance of lifestyle changes and weight loss and improving her metabolic syndrome. -She has done well when she was on room with weight loss, she did plateau which is not uncommon, we did discuss that her physical activity is limited due to back issues and the expectations should account for that. -I also explained to her that we do not do weight loss management in this office.  We did discuss Dr. Dennard Nip is an option for weight loss management.    Follow-up in 6 months   Signed electronically by: Mack Guise, MD  [facility] Endocrinology  [facility] [address] [place], [place] [address] Phone: [phone] FAX: [phone]   CC: Aurea [address]Marlou Sa, [address] [place] [address] Phone: [phone] Fax: [phone]   Return to Endocrinology clinic as below: Future Appointments  Date Time Provider [place]  03/19/2020  8:30 AM Jermari Tamargo, Melanie Crazier, MD [id number] None

## 2019-09-18 DIAGNOSIS — Z6838 Body mass index (BMI) 38.0-38.9, adult: Secondary | ICD-10-CM | POA: Insufficient documentation

## 2019-10-02 ENCOUNTER — Other Ambulatory Visit: Payer: Self-pay | Admitting: Obstetrics and Gynecology

## 2019-10-02 ENCOUNTER — Ambulatory Visit
Admission: RE | Admit: 2019-10-02 | Discharge: 2019-10-02 | Disposition: A | Discharge status: Home / Self Care | Payer: BC Managed Care – PPO | Source: Ambulatory Visit | Attending: Obstetrics and Gynecology | Admitting: Obstetrics and Gynecology

## 2019-10-02 ENCOUNTER — Other Ambulatory Visit: Payer: Self-pay

## 2019-10-02 DIAGNOSIS — Z124 Encounter for screening for malignant neoplasm of cervix: Secondary | ICD-10-CM | POA: Diagnosis not present

## 2019-10-02 DIAGNOSIS — Z8049 Family history of malignant neoplasm of other genital organs: Secondary | ICD-10-CM | POA: Diagnosis not present

## 2019-10-02 DIAGNOSIS — N951 Menopausal and female climacteric states: Secondary | ICD-10-CM | POA: Diagnosis not present

## 2019-10-02 DIAGNOSIS — Z1231 Encounter for screening mammogram for malignant neoplasm of breast: Secondary | ICD-10-CM

## 2019-10-02 DIAGNOSIS — Z01419 Encounter for gynecological examination (general) (routine) without abnormal findings: Secondary | ICD-10-CM | POA: Diagnosis not present

## 2019-10-02 DIAGNOSIS — Z6837 Body mass index (BMI) 37.0-37.9, adult: Secondary | ICD-10-CM | POA: Diagnosis not present

## 2019-10-02 DIAGNOSIS — Z803 Family history of malignant neoplasm of breast: Secondary | ICD-10-CM | POA: Diagnosis not present

## 2019-10-30 DIAGNOSIS — Z01818 Encounter for other preprocedural examination: Secondary | ICD-10-CM | POA: Diagnosis not present

## 2019-11-19 ENCOUNTER — Telehealth: Payer: Self-pay | Admitting: Hematology

## 2019-11-19 NOTE — Telephone Encounter (Signed)
Received a new patient referral from Dr. Ewing Schlein for increased risk for colon and breast cancer. Gina Norman has been cld and scheduled to see Dr. Mosetta Putt on 6/3 at 3pm. Pt aware to arrive 15 minutes early.

## 2019-11-25 ENCOUNTER — Encounter: Payer: BC Managed Care – PPO | Admitting: Licensed Clinical Social Worker

## 2019-12-06 NOTE — Progress Notes (Signed)
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 Watt Climes referred her  HISTORY OF PRESENTING ILLNESS:  Gina Norman 54 y.o. female is a here because of PMS2 mutation (+). Dr Ouida Sills did genetic testing and Dr. Watt Climes referred her. The patient presents to the clinic today accompanied by her husband.   Her PGM had breast cancer when she was 63 and died in her 68s. Her paternal cousin died from kidney cancer at age of 51. She had 2 other paternal cousins passed from breast cancer in their 32s. Her Maternal first cousin had Endometrial cancer in her late 34s. Her cousin was told the cause was environmental. Her maternal aunt (mother of cousin with endometrial cancer) had gallgladder cancer.  She notes she has 2 children and a brother. She denies history of any cancer herself.  She notes she has not scheduled her next colonoscopy with DR [facility] yet. She also notes her Gyn would like her to have Hysterectomy and BSO as soon as possible.   Socially she is married with 2 adult children. She drinks occasionally. She smoked intermittently for about 10 years, less than 1/2 ppd. She quit smoking 15 years ago. She is currently unemployed and looking for a job.  She has not significant past medical history except HTN and HLD. She just started Metformin 2 months due to PCOS. She had MSK surgeries, no hysterectomy. She notes she just started her menopause. She has only mild hot flashes and has been taking Tumeric to help.    REVIEW OF SYSTEMS:    Constitutional: Denies fevers, chills or abnormal night sweats (+) Hot flashes  Eyes: Denies blurriness of vision, double vision or watery  eyes Ears, nose, mouth, throat, and face: Denies mucositis or sore throat Respiratory: Denies cough, dyspnea or wheezes Cardiovascular: Denies palpitation, chest discomfort or lower extremity swelling Gastrointestinal:  Denies nausea, heartburn or change in bowel habits Skin: Denies abnormal skin rashes Lymphatics: Denies new lymphadenopathy or easy bruising Neurological:Denies numbness, tingling or new weaknesses Behavioral/Psych: Mood is stable, no new changes  All other systems were reviewed with the patient and are negative.   MEDICAL HISTORY:  Past Medical History:  Diagnosis Date  . ADD (attention deficit disorder)   . Back pain   . Complication of anesthesia    hard to wake up  . DDD (degenerative disc disease), lumbosacral   . Hypertension   . Wears contact lenses     SURGICAL HISTORY: Past Surgical History:  Procedure Laterality Date  . CARPAL TUNNEL RELEASE Left 08/14/2014   Procedure: LEFT CARPAL TUNNEL RELEASE;  Surgeon: Daryll Brod, MD;  Location: [place];  Service: Orthopedics;  Laterality: Left;  IV REGIONAL FAB  . CARPAL TUNNEL RELEASE Right 09/23/2014   Procedure: RIGHT CARPAL TUNNEL RELEASE;  Surgeon: Daryll Brod, MD;  Location: [place];  Service: Orthopedics;  Laterality: Right;  . ORIF ANKLE FRACTURE Left 04/12/2018   Procedure: OPEN REDUCTION INTERNAL FIXATION (ORIF) ANKLE FRACTURE;  Surgeon: Erle Crocker, MD;  Location: [place];  Service: Orthopedics;  Laterality: Left;  . TONSILLECTOMY      SOCIAL HISTORY: Social History   Socioeconomic History  .  Marital status: Married    Spouse name: Not on file  . Number of children: 2  . Years of education: Not on file  . Highest education level: Not on file  Occupational History  . Not on file  Tobacco Use  . Smoking status: Former Smoker    Packs/day: 0.50    Years: 10.00    Pack years: 5.00    Quit date: 03/12/2008    Years since quitting: 11.7  . Smokeless tobacco:  Never Used  Substance and Sexual Activity  . Alcohol use: Yes    Comment: occ. wine  . Drug use: No  . Sexual activity: Not on file  Other Topics Concern  . Not on file  Social History Narrative  . Not on file   Social Determinants of Health   Financial Resource Strain:   . Difficulty of Paying Living Expenses:   Food Insecurity:   . Worried About Charity fundraiser in the Last Year:   . Arboriculturist in the Last Year:   Transportation Needs:   . Film/video editor (Medical):   [name] Kitchen Lack of Transportation (Non-Medical):   Physical Activity:   . Days of Exercise per Week:   . Minutes of Exercise per Session:   Stress:   . Feeling of Stress :   Social Connections:   . Frequency of Communication with Friends and Family:   . Frequency of Social Gatherings with Friends and Family:   . Attends Religious Services:   . Active Member of Clubs or Organizations:   . Attends Archivist Meetings:   [name] Kitchen Marital Status:   Intimate Partner Violence:   . Fear of Current or Ex-Partner:   . Emotionally Abused:   [name] Kitchen Physically Abused:   . Sexually Abused:     FAMILY HISTORY: Family History  Problem Relation Age of Onset  . Cancer Maternal Aunt        gallbladder cancer  . Cancer Paternal Grandmother 34       breast cancer   . Cancer Cousin        endometrial cancer  . Cancer Cousin        breast cancer  . Cancer Cousin        kidney cancer    ALLERGIES:  is allergic to erythromycin.  MEDICATIONS:  Current Outpatient Medications  Medication Sig Dispense Refill  . amphetamine-dextroamphetamine (ADDERALL) 15 MG tablet Take 15 mg by mouth every morning.   0  . cetirizine (ZYRTEC) 10 MG tablet Take 10 mg by mouth daily.    [name] Kitchen ezetimibe (ZETIA) 10 MG tablet Take 10 mg by mouth daily.    [name] Kitchen gabapentin (NEURONTIN) 300 MG capsule Take 300-600 mg by mouth See admin instructions. 300 mg in the morning, 600 mg at bedtime    . lisinopril-hydrochlorothiazide  (PRINZIDE,ZESTORETIC) 10-12.5 MG per tablet Take 1 tablet by mouth daily.    . Melatonin 2.5 MG CAPS Take by mouth.    . metFORMIN (GLUCOPHAGE XR) 500 MG 24 hr tablet Take 1 tablet (500 mg total) by mouth in the morning and at bedtime. 180 tablet 1  . metoprolol succinate (TOPROL-XL) 50 MG 24 hr tablet Take 50 mg by mouth daily.  5  . montelukast (SINGULAIR) 10 MG tablet Take 10 mg by mouth at bedtime.   1  . Naproxen Sodium (ALEVE) 220 MG CAPS Aleve    . Probiotic Product (PROBIOTIC DAILY PO) Take 1 capsule by mouth at bedtime.    [name] Kitchen  Turmeric 450 MG CAPS Take 1 capsule by mouth daily.     No current facility-administered medications for this visit.    PHYSICAL EXAMINATION: ECOG PERFORMANCE STATUS: 0 - Asymptomatic  Vitals:   12/12/19 1512  BP: (!) 150/99  Pulse: 94  Resp: 20  Temp: (!) 97.2 F (36.2 C)  SpO2: 99%   Filed Weights   12/12/19 1512  Weight: 213 lb 12.8 oz (97 kg)    Due to COVID19 we will limit examination to appearance. Patient had no complaints.  GENERAL:alert, no distress and comfortable SKIN: skin color normal, no rashes or significant lesions EYES: normal, Conjunctiva are pink and non-injected, sclera clear  NEURO: alert & oriented x 3 with fluent speech   LABORATORY DATA:  I have reviewed the data as listed CBC Latest Ref Rng & Units 04/12/2018 09/23/2014 08/14/2014  WBC 4.0 - 10.5 K/uL 6.7 - -  Hemoglobin 12.0 - 15.0 g/dL 12.8 13.6 14.7  Hematocrit 36.0 - 46.0 % 40.3 40.0 -  Platelets 150 - 400 K/uL 165 - -    CMP Latest Ref Rng & Units 04/12/2018 09/23/2014 08/13/2014  Glucose 70 - 99 mg/dL 100(H) 116(H) 107(H)  BUN 6 - 20 mg/dL _0 Creatinine 0.44 - 1.00 mg/dL 0.83 0.70 0.74  Sodium 135 - 145 mmol/L 138 138 137  Potassium 3.5 - 5.1 mmol/L 4.0 3.5 4.3  Chloride 98 - 111 mmol/L 103 106 104  CO2 22 - 32 mmol/L 22 - 24  Calcium 8.9 - 10.3 mg/dL 9.3 - 9.2  Total Protein 6.0 - 8.3 g/dL - - -  Total Bilirubin 0.3 - 1.2 mg/dL - - -  Alkaline Phos 39  - 117 U/L - - -  AST 0 - 37 U/L - - -  ALT 0 - 35 U/L - - -     RADIOGRAPHIC STUDIES: I have personally reviewed the radiological images as listed and agreed with the findings in the report. No results found.  ASSESSMENT & PLAN:  DYONNA JASPERS is a 54 y.o.  Caucasian female with a history of DDD and HTN, PCOS    1. Lynch Syndrome with PMS2 mutation (+), High risk for Cancer  -I reviewed her family history of cancer. Her PGM had breast cancer when she was 37 and died in her 35s. Her paternal cousin died from kidney cancer at age of 9. She had 2 other paternal cousins passed from breast cancer in their 23s. Her Maternal first cousin had Endometrial cancer in her late 25s. Her maternal aunt (mother of cousin with endometrial cancer) had Gallgladder cancer.  -I reviewed her genetic test results. 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 She has increased risk of developing certain cancers, especially colon cancer and endometrial cancer, however the risk is lower than other Lynch syndrome related gene mutations, such as MLH1. -Patients with PMS2 mutations carriers 15-20% lifetime risk of colon cancer by age of 41,  With the media age of onset in 66s, and 15% lifetime risk of endometrial cancer by age of 89, with media age of onset 54 years old. -PMS2 mutation carrier also has slightly increased risk of extra chronic malignancy, such as stomach, ovary, hepatobiliary track, urinary tract, small bowel, brain, and the combined risk for these malignancies is 6% to age 41. Routine screening for these malignancy are not established and not recommended. -I strongly  recommend her to start screening colonoscopy, every one to 2 years, to detected earlier stage colon cancer or precancer lesions. We also discussed varous other screening tools such as CT colonography and  stool test. After lengthy discussion, she agrees to have screening colonoscopy. She will continue to be followed by Dr Watt Climes.  -I also discussed the benefit of aspirin to reduce risk of colon cancer. This has been showed in multiple clinical trials, but has not been the standard recommendation in the high-risk population like her. Recently the CAPP2 trial demonstrated aspirin 600 mg daily for 2 years reduced 4% risk of colon cancer in 10 years in Lynch syndrome patients.  Of high-dose aspirin were discussed with her, given her relatively lower-risk of colon caner then other gene mutation related to Lynch syndrome, I do not strongly recommend aspirin 600 mg daily.   -I also recommend prophylactic hysterectomy and BSO. She has started menopause recently, she is unsure about proceeding with surgery due to possible complications. I recommend she discuss this further with her Gyn who plans to do her surgery. I discussed if she does not proceed with hysterectomy, I would recommend she screen with yearly pelvic exam and Endometrial Biopsy. -Given her paternal family history of breast cancer her future risk of breast cancer is 18.6%. Although she is slightly above the population average of 12%, she is not considered the 20% high risk. I reviewed continuing yearly mammogram and breast exams. With her moderate risk, she is eligible for additional screening with breast MRI yearly. She will think about it.  -I discussed PMS2 in males can increase risk for Prostate cancer.  -I discussed overall health maintenance with exercise, diet and positive mindset can reduce her risk of cancer. She is currently working on losing weight and managing her PCOS. I discussed she can take Oral Vit D and check her Vit D levels with her Physicians.  -I will f/u with her as needed in the future. She will continue to f/u with her other physicians for management.   2. Comorbidities: HTN and HLD, Overweight, Sleep Apnea -On medication.    -She just started Metformin 2 months ago due to PCOS.  -To help her lose weight she plans to start CPAP machine and managing her sugar on Metformin. I discussed the option of [facility] weight management clinic.     Plan  -Screening colonoscopy every 1-2 years with Dr. Watt Climes -Screening mammogram yearly, consider screening breast MRI yearly  -She will follow-up with her other physicians and only see me on as-needed basis in the future.    Orders Placed This Encounter  Procedures  . Amb Ref to Medical Weight Management    Referral Priority:   Routine    Referral Type:   Consultation    Number of Visits Requested:   1    All questions were answered. The patient knows to call the clinic with any problems, questions or concerns. The total time spent in the appointment was 45 minutes.     Truitt Merle, MD 12/12/2019   I, Joslyn Devon, am acting as scribe for Truitt Merle, MD.   I have reviewed the above documentation for accuracy and completeness, and I agree with the above.

## 2019-12-12 ENCOUNTER — Inpatient Hospital Stay: Payer: BC Managed Care – PPO | Attending: Hematology | Admitting: Hematology

## 2019-12-12 ENCOUNTER — Encounter: Payer: Self-pay | Admitting: Hematology

## 2019-12-12 ENCOUNTER — Other Ambulatory Visit: Payer: Self-pay

## 2019-12-12 VITALS — BP 150/99 | HR 94 | Temp 97.2°F | Resp 20 | Ht 63.0 in | Wt 213.8 lb

## 2019-12-12 DIAGNOSIS — Z803 Family history of malignant neoplasm of breast: Secondary | ICD-10-CM | POA: Diagnosis not present

## 2019-12-12 DIAGNOSIS — Z1509 Genetic susceptibility to other malignant neoplasm: Secondary | ICD-10-CM | POA: Insufficient documentation

## 2019-12-12 DIAGNOSIS — E669 Obesity, unspecified: Secondary | ICD-10-CM | POA: Insufficient documentation

## 2019-12-12 DIAGNOSIS — I10 Essential (primary) hypertension: Secondary | ICD-10-CM | POA: Insufficient documentation

## 2019-12-12 DIAGNOSIS — Z800 Family history of malignant neoplasm of digestive organs: Secondary | ICD-10-CM

## 2019-12-14 ENCOUNTER — Encounter: Payer: Self-pay | Admitting: Hematology

## 2020-01-06 ENCOUNTER — Encounter (INDEPENDENT_AMBULATORY_CARE_PROVIDER_SITE_OTHER): Payer: Self-pay | Admitting: Bariatrics

## 2020-01-06 ENCOUNTER — Other Ambulatory Visit: Payer: Self-pay

## 2020-01-06 ENCOUNTER — Ambulatory Visit (INDEPENDENT_AMBULATORY_CARE_PROVIDER_SITE_OTHER): Payer: BC Managed Care – PPO | Admitting: Bariatrics

## 2020-01-06 VITALS — BP 121/82 | HR 83 | Temp 98.5°F | Ht 63.0 in | Wt 209.2 lb

## 2020-01-06 DIAGNOSIS — R7301 Impaired fasting glucose: Secondary | ICD-10-CM

## 2020-01-06 DIAGNOSIS — G4739 Other sleep apnea: Secondary | ICD-10-CM

## 2020-01-06 DIAGNOSIS — E6609 Other obesity due to excess calories: Secondary | ICD-10-CM | POA: Insufficient documentation

## 2020-01-06 DIAGNOSIS — E282 Polycystic ovarian syndrome: Secondary | ICD-10-CM | POA: Diagnosis not present

## 2020-01-06 DIAGNOSIS — R0602 Shortness of breath: Secondary | ICD-10-CM

## 2020-01-06 DIAGNOSIS — R5383 Other fatigue: Secondary | ICD-10-CM | POA: Diagnosis not present

## 2020-01-06 DIAGNOSIS — Z1331 Encounter for screening for depression: Secondary | ICD-10-CM

## 2020-01-06 DIAGNOSIS — Z6837 Body mass index (BMI) 37.0-37.9, adult: Secondary | ICD-10-CM

## 2020-01-06 DIAGNOSIS — Z9189 Other specified personal risk factors, not elsewhere classified: Secondary | ICD-10-CM

## 2020-01-06 DIAGNOSIS — E781 Pure hyperglyceridemia: Secondary | ICD-10-CM | POA: Diagnosis not present

## 2020-01-06 DIAGNOSIS — G4733 Obstructive sleep apnea (adult) (pediatric): Secondary | ICD-10-CM | POA: Diagnosis not present

## 2020-01-06 DIAGNOSIS — I10 Essential (primary) hypertension: Secondary | ICD-10-CM

## 2020-01-06 DIAGNOSIS — E66812 Obesity, class 2: Secondary | ICD-10-CM | POA: Insufficient documentation

## 2020-01-06 DIAGNOSIS — E7800 Pure hypercholesterolemia, unspecified: Secondary | ICD-10-CM

## 2020-01-06 DIAGNOSIS — Z0289 Encounter for other administrative examinations: Secondary | ICD-10-CM

## 2020-01-06 NOTE — Progress Notes (Signed)
Dear Dr. Malachy Mood,   Thank you for referring Gina Norman to our clinic. The following note includes my evaluation and treatment recommendations.  Chief Complaint:   OBESITY Gina Norman (MR# [id number]) is a 54 y.o. female who presents for evaluation and treatment of obesity and related comorbidities. Current BMI is Body mass index is 37.06 kg/m.[name] Kitchen Gina Norman has been struggling with her weight for many years and has been unsuccessful in either losing weight, maintaining weight loss, or reaching her healthy weight goal.  Gina Norman is currently in the action stage of change and ready to dedicate time achieving and maintaining a healthier weight. Gina Norman is interested in becoming our patient and working on intensive lifestyle modifications including (but not limited to) diet and exercise for weight loss.  Gina Norman likes to [employer], but has time constraints. She does snack after dinner and she skips lunch.  Gina Norman's habits were reviewed today and are as follows: Her family eats meals together, she thinks her family will eat healthier with her, she struggles with family and or coworkers weight loss sabotage, her desired weight loss is 59 lbs, she has been heavy for the last 15 years, she started gaining weight at the age of 35, her heaviest weight ever was 218 pounds, she craves savory and sweets, she snacks frequently in the evenings, she skips lunch frequently, she frequently eats larger portions than normal, she has binge eating behaviors and she struggles with emotional eating.  Depression Screen Gina Norman's Food and Mood (modified PHQ-9) score was 13.  Depression screen PHQ 2/9 01/06/2020  Decreased Interest 2  Down, Depressed, Hopeless 1  PHQ - 2 Score 3  Altered sleeping 2  Tired, decreased energy 2  Change in appetite 2  Feeling bad or failure about yourself  1  Trouble concentrating 1  Moving slowly or fidgety/restless 2  Suicidal thoughts 0  PHQ-9 Score 13  Difficult doing work/chores  Very difficult   Subjective:   Other fatigue. Gina Norman denies daytime somnolence and admits to waking up still tired. Gina Norman generally gets 7-8 hours of sleep per night, and states that she does not sleep well most nights. Snoring is present. Apneic episodes are not present. Epworth Sleepiness Score is 2.  SOB (shortness of breath) on exertion. Gina Norman notes increasing shortness of breath with certain activities and seems to be worsening over time with weight gain. She notes getting out of breath sooner with activity than she used to. This has gotten worse recently. Gina Norman denies shortness of breath at rest or orthopnea.  Impaired fasting glucose. Gina Norman is taking metformin. She does not have a diagnosis of diabetes mellitus and no family history.  PCOS (polycystic ovarian syndrome). Gina Norman is taking metformin.  Hypertriglyceridemia.  Gina Norman is taking Zetia and Fish Oil.  Essential hypertension. Gina Norman is taking lisinopril/HCTZ and metoprolol.  BP Readings from Last 3 Encounters:  01/06/20 121/82  12/12/19 (!) 150/99  09/17/19 110/68   Lab Results  Component Value Date   CREATININE 0.83 04/12/2018   CREATININE 0.70 09/23/2014   CREATININE 0.74 08/13/2014   Elevated cholesterol. Gina Norman is taking ezetimibe.  Other sleep apnea. Gina Norman reports mild sleep apnea. She is taking melatonin and will get CPAP.  Depression screening. Gina Norman had a moderately positive depression screen with a PHQ-9 score of 13.  At risk of diabetes mellitus. Gina Norman is at higher than average risk for developing diabetes due to impaired fasting glucose and PCOS.   Assessment/Plan:   Other fatigue. Gina Norman does feel that  her weight is causing her energy to be lower than it should be. Fatigue may be related to obesity, depression or many other causes. Labs will be ordered, and in the meanwhile, Gina Norman will focus on self care including making healthy food choices, increasing physical activity and focusing on stress  reduction. EKG 12-Lead, Comprehensive metabolic panel, VITAMIN D 25 Hydroxy (Vit-D Deficiency, Fractures), T3, T4, free, TSH testing ordered today.  SOB (shortness of breath) on exertion. Gina Norman does feel that she gets out of breath more easily that she used to when she exercises. Gina Norman's shortness of breath appears to be obesity related and exercise induced. She has agreed to work on weight loss and gradually increase exercise to treat her exercise induced shortness of breath. Will continue to monitor closely. Comprehensive metabolic panel ordered today.  Impaired fasting glucose. Gina Norman will continue metformin as directed. Hemoglobin A1c, Insulin, random ordered today.  PCOS (polycystic ovarian syndrome). Will check A1c and insulin today.  Hypertriglyceridemia. Gina Norman will decrease carbohydrates and increase healthy fats and protein. Lipid Panel With LDL/HDL Ratio ordered today.  Essential hypertension. Gina Norman is working on healthy weight loss and exercise to improve blood pressure control. We will watch for signs of hypotension as she continues her lifestyle modifications. She will continue her medications as directed.  Elevated cholesterol. Gina Norman will continue her medication as directed.   Other sleep apnea. Intensive lifestyle modifications are the first line treatment for this issue. We discussed several lifestyle modifications today and she will continue to work on diet, exercise and weight loss efforts. We will continue to monitor. Orders and follow up as documented in patient record. Gina Norman will begin using a CPAP. We discussed the importance of restful sleep.  Counseling  Sleep apnea is a condition in which breathing pauses or becomes shallow during sleep. This happens over and over during the night. This disrupts your sleep and keeps your body from getting the rest that it needs, which can cause tiredness and lack of energy (fatigue) during the day.  Sleep apnea treatment: If you  were given a device to open your airway while you sleep, USE IT!  Sleep hygiene:   Limit or avoid alcohol, caffeinated beverages, and cigarettes, especially close to bedtime.   Do not eat a large meal or eat spicy foods right before bedtime. This can lead to digestive discomfort that can make it hard for you to sleep.  Keep a sleep diary to help you and your health care provider figure out what could be causing your insomnia.  . Make your bedroom a dark, comfortable place where it is easy to fall asleep. ? Put up shades or blackout curtains to block light from outside. ? Use a white noise machine to block noise. ? Keep the temperature cool. . Limit screen use before bedtime. This includes: ? Watching TV. ? Using your smartphone, tablet, or computer. . Stick to a routine that includes going to bed and waking up at the same times every day and night. This can help you fall asleep faster. Consider making a quiet activity, such as reading, part of your nighttime routine. . Try to avoid taking naps during the day so that you sleep better at night. . Get out of bed if you are still awake after 15 minutes of trying to sleep. Keep the lights down, but try reading or doing a quiet activity. When you feel sleepy, go back to bed.  Depression screening. Gina Norman had a positive depression screening. Depression is commonly associated  with obesity and often results in emotional eating behaviors. We will monitor this closely and work on CBT to help improve the non-hunger eating patterns. Referral to Psychology may be required if no improvement is seen as she continues in our clinic.  At risk of diabetes mellitus. Gina Norman was given approximately 15 minutes of diabetes education and counseling today. We discussed intensive lifestyle modifications today with an emphasis on weight loss as well as increasing exercise and decreasing simple carbohydrates in her diet. We also reviewed medication options with an emphasis  on risk versus benefit of those discussed.   Repetitive spaced learning was employed today to elicit superior memory formation and behavioral change.  Class 2 severe obesity with serious comorbidity and body mass index (BMI) of 37.0 to 37.9 in adult, unspecified obesity type (HCC).  Gina Norman is currently in the action stage of change and her goal is to continue with weight loss efforts. I recommend Gina Norman begin the structured treatment plan as follows:  She has agreed to the Category 2 Plan.  She will work on meal planning and intentional eating.   We reviewed with the patient labs from [facility] including CMP and lipids.  Exercise goals: All adults should avoid inactivity. Some physical activity is better than none, and adults who participate in any amount of physical activity gain some health benefits.   Behavioral modification strategies: increasing lean protein intake, decreasing simple carbohydrates, increasing vegetables, increasing water intake, decreasing eating out, no skipping meals, meal planning and cooking strategies, keeping healthy foods in the home and planning for success.  She was informed of the importance of frequent follow-up visits to maximize her success with intensive lifestyle modifications for her multiple health conditions. She was informed we would discuss her lab results at her next visit unless there is a critical issue that needs to be addressed sooner. Gina Norman agreed to keep her next visit at the agreed upon time to discuss these results.  Objective:   Blood pressure 121/82, pulse 83, temperature 98.5 F (36.9 C), height 5\' 3"  (1.6 m), weight 209 lb 3.2 oz (94.9 kg), last menstrual period 09/03/2014, SpO2 98 %. Body mass index is 37.06 kg/m.  EKG: Sinus  Rhythm with a rate of 89 BPM. Poor R wave progression. Otherwise normal.   Indirect Calorimeter completed today shows a VO2 of 230 and a REE of 1602.  Her calculated basal metabolic rate is 09/05/2014 thus her basal  metabolic rate is worse than expected.  General: Cooperative, alert, well developed, in no acute distress. HEENT: Conjunctivae and lids unremarkable. Cardiovascular: Regular rhythm.  Lungs: Normal work of breathing. Neurologic: No focal deficits.   Lab Results  Component Value Date   CREATININE 0.83 04/12/2018   BUN 13 04/12/2018   NA 138 04/12/2018   K 4.0 04/12/2018   CL 103 04/12/2018   CO2 22 04/12/2018   Lab Results  Component Value Date   ALT 17 12/27/2008   AST 28 12/27/2008   ALKPHOS 115 12/27/2008   BILITOT 0.5 12/27/2008   No results found for: HGBA1C No results found for: INSULIN No results found for: TSH No results found for: CHOL, HDL, LDLCALC, LDLDIRECT, TRIG, CHOLHDL Lab Results  Component Value Date   WBC 6.7 04/12/2018   HGB 12.8 04/12/2018   HCT 40.3 04/12/2018   MCV 98.5 04/12/2018   PLT 165 04/12/2018   No results found for: IRON, TIBC, FERRITIN  Attestation Statements:   Reviewed by clinician on day of visit: allergies, medications, problem  list, medical history, surgical history, family history, social history, and previous encounter notes.  Migdalia Dk, am acting as Location manager for [employer], DO   I have reviewed the above documentation for accuracy and completeness, and I agree with the above. Jearld Lesch, DO

## 2020-01-07 ENCOUNTER — Encounter (INDEPENDENT_AMBULATORY_CARE_PROVIDER_SITE_OTHER): Payer: Self-pay | Admitting: Bariatrics

## 2020-01-07 DIAGNOSIS — E785 Hyperlipidemia, unspecified: Secondary | ICD-10-CM | POA: Insufficient documentation

## 2020-01-07 DIAGNOSIS — R7989 Other specified abnormal findings of blood chemistry: Secondary | ICD-10-CM | POA: Insufficient documentation

## 2020-01-07 DIAGNOSIS — R7303 Prediabetes: Secondary | ICD-10-CM | POA: Insufficient documentation

## 2020-01-07 DIAGNOSIS — E559 Vitamin D deficiency, unspecified: Secondary | ICD-10-CM | POA: Insufficient documentation

## 2020-01-07 LAB — LIPID PANEL WITH LDL/HDL RATIO
Cholesterol, Total: 272 mg/dL — ABNORMAL HIGH (ref 100–199)
HDL: 49 mg/dL (ref >39)
LDL Chol Calc (NIH): 179 mg/dL — ABNORMAL HIGH (ref 0–99)
LDL/HDL Ratio: 3.7 ratio — ABNORMAL HIGH (ref 0.0–3.2)
Triglycerides: 235 mg/dL — ABNORMAL HIGH (ref 0–149)
VLDL Cholesterol Cal: 44 mg/dL — ABNORMAL HIGH (ref 5–40)

## 2020-01-07 LAB — COMPREHENSIVE METABOLIC PANEL
ALT: 90 IU/L — ABNORMAL HIGH (ref 0–32)
AST: 83 IU/L — ABNORMAL HIGH (ref 0–40)
Albumin/Globulin Ratio: 1.9 (ref 1.2–2.2)
Albumin: 5 g/dL — ABNORMAL HIGH (ref 3.8–4.9)
Alkaline Phosphatase: 89 IU/L (ref 48–121)
BUN/Creatinine Ratio: 23 (ref 9–23)
BUN: 18 mg/dL (ref 6–24)
Bilirubin Total: 0.4 mg/dL (ref 0.0–1.2)
CO2: 23 mmol/L (ref 20–29)
Calcium: 10 mg/dL (ref 8.7–10.2)
Chloride: 98 mmol/L (ref 96–106)
Creatinine, Ser: 0.78 mg/dL (ref 0.57–1.00)
GFR calc Af Amer: 101 mL/min/{1.73_m2} (ref >59)
GFR calc non Af Amer: 88 mL/min/{1.73_m2} (ref >59)
Globulin, Total: 2.6 g/dL (ref 1.5–4.5)
Glucose: 103 mg/dL — ABNORMAL HIGH (ref 65–99)
Potassium: 4.3 mmol/L (ref 3.5–5.2)
Sodium: 138 mmol/L (ref 134–144)
Total Protein: 7.6 g/dL (ref 6.0–8.5)

## 2020-01-07 LAB — VITAMIN D 25 HYDROXY (VIT D DEFICIENCY, FRACTURES): Vit D, 25-Hydroxy: 27.3 ng/mL — ABNORMAL LOW (ref 30.0–100.0)

## 2020-01-07 LAB — T3: T3, Total: 128 ng/dL (ref 71–180)

## 2020-01-07 LAB — HEMOGLOBIN A1C
Est. average glucose Bld gHb Est-mCnc: 120 mg/dL
Hgb A1c MFr Bld: 5.8 % — ABNORMAL HIGH (ref 4.8–5.6)

## 2020-01-07 LAB — T4, FREE: Free T4: 1.06 ng/dL (ref 0.82–1.77)

## 2020-01-07 LAB — TSH: TSH: 2.85 u[IU]/mL (ref 0.450–4.500)

## 2020-01-07 LAB — INSULIN, RANDOM: INSULIN: 32.7 u[IU]/mL — ABNORMAL HIGH (ref 2.6–24.9)

## 2020-01-08 ENCOUNTER — Encounter (INDEPENDENT_AMBULATORY_CARE_PROVIDER_SITE_OTHER): Payer: Self-pay | Admitting: Bariatrics

## 2020-01-08 DIAGNOSIS — R748 Abnormal levels of other serum enzymes: Secondary | ICD-10-CM | POA: Insufficient documentation

## 2020-01-20 ENCOUNTER — Other Ambulatory Visit: Payer: Self-pay

## 2020-01-20 ENCOUNTER — Ambulatory Visit (INDEPENDENT_AMBULATORY_CARE_PROVIDER_SITE_OTHER): Payer: BC Managed Care – PPO | Admitting: Bariatrics

## 2020-01-20 ENCOUNTER — Encounter (INDEPENDENT_AMBULATORY_CARE_PROVIDER_SITE_OTHER): Payer: Self-pay | Admitting: Bariatrics

## 2020-01-20 VITALS — BP 117/80 | HR 84 | Temp 98.4°F | Ht 63.0 in | Wt 204.0 lb

## 2020-01-20 DIAGNOSIS — R7303 Prediabetes: Secondary | ICD-10-CM | POA: Diagnosis not present

## 2020-01-20 DIAGNOSIS — Z6836 Body mass index (BMI) 36.0-36.9, adult: Secondary | ICD-10-CM

## 2020-01-20 DIAGNOSIS — E782 Mixed hyperlipidemia: Secondary | ICD-10-CM | POA: Diagnosis not present

## 2020-01-20 DIAGNOSIS — E559 Vitamin D deficiency, unspecified: Secondary | ICD-10-CM

## 2020-01-20 DIAGNOSIS — E7849 Other hyperlipidemia: Secondary | ICD-10-CM

## 2020-01-20 DIAGNOSIS — Z9189 Other specified personal risk factors, not elsewhere classified: Secondary | ICD-10-CM | POA: Diagnosis not present

## 2020-01-20 DIAGNOSIS — F900 Attention-deficit hyperactivity disorder, predominantly inattentive type: Secondary | ICD-10-CM | POA: Diagnosis not present

## 2020-01-20 DIAGNOSIS — E669 Obesity, unspecified: Secondary | ICD-10-CM | POA: Diagnosis not present

## 2020-01-20 DIAGNOSIS — R7989 Other specified abnormal findings of blood chemistry: Secondary | ICD-10-CM

## 2020-01-20 DIAGNOSIS — I10 Essential (primary) hypertension: Secondary | ICD-10-CM | POA: Diagnosis not present

## 2020-01-20 MED ORDER — VITAMIN D (ERGOCALCIFEROL) 1.25 MG (50000 UNIT) PO CAPS: 50000.0000 [IU] | ORAL_CAPSULE | ORAL | 0 refills | Status: DC

## 2020-01-21 ENCOUNTER — Encounter (INDEPENDENT_AMBULATORY_CARE_PROVIDER_SITE_OTHER): Payer: Self-pay | Admitting: Bariatrics

## 2020-01-21 NOTE — Progress Notes (Signed)
Chief Complaint:   OBESITY Gina Norman is here to discuss her progress with her obesity treatment plan along with follow-up of her obesity related diagnoses. Gina Norman is on the Category 2 Plan and states she is following her eating plan approximately 100% of the time. Gina Norman states she is walking 6,000 steps daily 5 times per week.  Today's visit was #: 2 Starting weight: 209 lbs Starting date: 01/06/2020 Today's weight: 204 lbs Today's date: 01/20/2020 Total lbs lost to date: 5 Total lbs lost since last in-office visit: 5  Interim History: Gina Norman is down 5 lbs. She denies any struggles, however, she does struggle slightly with her water intake.  Subjective:   Vitamin D deficiency. Gina Norman is taking OTC Vitamin D supplementation.    Ref. Range 01/06/2020 14:40  Vitamin D, 25-Hydroxy Latest Ref Range: 30.0 - 100.0 ng/mL 27.3 (L)   Prediabetes. Gina Norman has a diagnosis of prediabetes based on her elevated HgA1c and was informed this puts her at greater risk of developing diabetes. She continues to work on diet and exercise to decrease her risk of diabetes. She denies nausea or hypoglycemia. Gina Norman is on no medications.  Lab Results  Component Value Date   HGBA1C 5.8 (H) 01/06/2020   Lab Results  Component Value Date   INSULIN 32.7 (H) 01/06/2020   Other hyperlipidemia. Gina Norman saw her PCP today.   Lab Results  Component Value Date   CHOL 272 (H) 01/06/2020   HDL 49 01/06/2020   LDLCALC 179 (H) 01/06/2020   TRIG 235 (H) 01/06/2020   Lab Results  Component Value Date   ALT 90 (H) 01/06/2020   AST 83 (H) 01/06/2020   ALKPHOS 89 01/06/2020   BILITOT 0.4 01/06/2020   The 10-year ASCVD risk score Denman George DC Jr., et al., 2013) is: 2.8%   Values used to calculate the score:     Age: 54 years     Sex: Female     Is Non-Hispanic African American: No     Diabetic: No     Tobacco smoker: No     Systolic Blood Pressure: 117 mmHg     Is BP treated: Yes     HDL Cholesterol:  49 mg/dL     Total Cholesterol: 272 mg/dL  Elevated LFTs. Gina Norman has a new dx of elevated ALT. Her BMI is over 40. She denies abdominal pain or jaundice and has never been told of any liver problems in the past. She denies excessive alcohol intake. Gina Norman saw her PCP today.  Lab Results  Component Value Date   ALT 90 (H) 01/06/2020   AST 83 (H) 01/06/2020   ALKPHOS 89 01/06/2020   BILITOT 0.4 01/06/2020   At risk for osteoporosis. Gina Norman is at higher risk of osteopenia and osteoporosis due to Vitamin D deficiency.   Assessment/Plan:   Vitamin D deficiency. Low Vitamin D level contributes to fatigue and are associated with obesity, breast, and colon cancer. She was given a prescription for Vitamin D, Ergocalciferol, (DRISDOL) 1.25 MG (50000 UNIT) CAPS capsule every week #4 with 0 refills and will follow-up for routine testing of Vitamin D, at least 2-3 times per year to avoid over-replacement.   Prediabetes. Gina Norman will continue to work on weight loss, increasing activity, and decreasing simple carbohydrates to help decrease the risk of diabetes.   Other hyperlipidemia. Cardiovascular risk and specific lipid/LDL goals reviewed.  We discussed several lifestyle modifications today and Gina Norman will continue to work on diet, exercise and weight  loss efforts. Orders and follow up as documented in patient record. Gina Norman's PCP is to change her medications.  Counseling Intensive lifestyle modifications are the first line treatment for this issue.  Dietary changes: Increase soluble fiber. Decrease simple carbohydrates.  Exercise changes: Moderate to vigorous-intensity aerobic activity 150 minutes per week if tolerated.  Lipid-lowering medications: see documented in medical record.  Elevated LFTs. We discussed the likely diagnosis of non-alcoholic fatty liver disease today and how this condition is obesity related. Gina Norman was educated the importance of weight loss. Gina Norman agreed to continue with  her weight loss efforts with healthier diet and exercise as an essential part of her treatment plan. Her PCP will stop the Zetia and change her medication for cholesterol. She will limit her ETOH and Tylenol intake and will recheck LFT's in 6-8 weeks.  At risk for osteoporosis. Gina Norman was given approximately 15 minutes of osteoporosis prevention counseling today. Gina Norman is at risk for osteopenia and osteoporosis due to her Vitamin D deficiency. She was encouraged to take her Vitamin D and follow her higher calcium diet and increase strengthening exercise to help strengthen her bones and decrease her risk of osteopenia and osteoporosis.  Repetitive spaced learning was employed today to elicit superior memory formation and behavioral change.  Class 2 severe obesity with serious comorbidity and body mass index (BMI) of 36.0 to 36.9 in adult, unspecified obesity type (HCC).  Gina Norman is currently in the action stage of change. As such, her goal is to continue with weight loss efforts. She has agreed to the Category 2 Plan.   She will work on meal planning and increasing her water intake.  We reviewed with the patient labs from 01/06/2020 including CMP, lipids, Vitamin D, A1c, insulin, and thyroid panel.  Exercise goals: Gina Norman will continue walking 6,000 steps daily 5 days per week.  Behavioral modification strategies: increasing lean protein intake, decreasing simple carbohydrates, increasing vegetables, increasing water intake, decreasing eating out, no skipping meals, meal planning and cooking strategies, keeping healthy foods in the home and planning for success.  Gina Norman has agreed to follow-up with our clinic in 2 weeks. She was informed of the importance of frequent follow-up visits to maximize her success with intensive lifestyle modifications for her multiple health conditions.   Objective:   Blood pressure 117/80, pulse 84, temperature 98.4 F (36.9 C), height 5\' 3"  (1.6 m), weight 204 lb  (92.5 kg), last menstrual period 09/03/2014, SpO2 98 %. Body mass index is 36.14 kg/m.  General: Cooperative, alert, well developed, in no acute distress. HEENT: Conjunctivae and lids unremarkable. Cardiovascular: Regular rhythm.  Lungs: Normal work of breathing. Neurologic: No focal deficits.   Lab Results  Component Value Date   CREATININE 0.78 01/06/2020   BUN 18 01/06/2020   NA 138 01/06/2020   K 4.3 01/06/2020   CL 98 01/06/2020   CO2 23 01/06/2020   Lab Results  Component Value Date   ALT 90 (H) 01/06/2020   AST 83 (H) 01/06/2020   ALKPHOS 89 01/06/2020   BILITOT 0.4 01/06/2020   Lab Results  Component Value Date   HGBA1C 5.8 (H) 01/06/2020   Lab Results  Component Value Date   INSULIN 32.7 (H) 01/06/2020   Lab Results  Component Value Date   TSH 2.850 01/06/2020   Lab Results  Component Value Date   CHOL 272 (H) 01/06/2020   HDL 49 01/06/2020   LDLCALC 179 (H) 01/06/2020   TRIG 235 (H) 01/06/2020   Lab Results  Component  Value Date   WBC 6.7 04/12/2018   HGB 12.8 04/12/2018   HCT 40.3 04/12/2018   MCV 98.5 04/12/2018   PLT 165 04/12/2018   No results found for: IRON, TIBC, FERRITIN  Attestation Statements:   Reviewed by clinician on day of visit: allergies, medications, problem list, medical history, surgical history, family history, social history, and previous encounter notes.  Fernanda Drum, am acting as Energy manager for [employer], DO   I have reviewed the above documentation for accuracy and completeness, and I agree with the above. Corinna Capra, DO

## 2020-02-04 ENCOUNTER — Ambulatory Visit (INDEPENDENT_AMBULATORY_CARE_PROVIDER_SITE_OTHER): Payer: BC Managed Care – PPO | Admitting: Bariatrics

## 2020-02-04 ENCOUNTER — Encounter (INDEPENDENT_AMBULATORY_CARE_PROVIDER_SITE_OTHER): Payer: Self-pay | Admitting: Bariatrics

## 2020-02-04 ENCOUNTER — Other Ambulatory Visit: Payer: Self-pay

## 2020-02-04 VITALS — BP 105/71 | HR 80 | Temp 98.8°F | Ht 63.0 in | Wt 201.0 lb

## 2020-02-04 DIAGNOSIS — E559 Vitamin D deficiency, unspecified: Secondary | ICD-10-CM

## 2020-02-04 DIAGNOSIS — Z6835 Body mass index (BMI) 35.0-35.9, adult: Secondary | ICD-10-CM

## 2020-02-04 DIAGNOSIS — R7989 Other specified abnormal findings of blood chemistry: Secondary | ICD-10-CM

## 2020-02-04 DIAGNOSIS — Z9189 Other specified personal risk factors, not elsewhere classified: Secondary | ICD-10-CM

## 2020-02-04 MED ORDER — VITAMIN D (ERGOCALCIFEROL) 1.25 MG (50000 UNIT) PO CAPS: 50000.0000 [IU] | ORAL_CAPSULE | ORAL | 0 refills | Status: DC

## 2020-02-04 NOTE — Progress Notes (Signed)
Chief Complaint:   OBESITY Gina Norman is here to discuss her progress with her obesity treatment plan along with follow-up of her obesity related diagnoses. [name] is on the Category 2 Plan and states she is following her eating plan approximately 95% of the time. [name] states she is walking 5,000-6,000 steps daily 7 times per week.  Today's visit was #: 3 Starting weight: 209 lbs Starting date: 01/06/2020 Today's weight: 201 lbs Today's date: 02/04/2020 Total lbs lost to date: 8 Total lbs lost since last in-office visit: 3  Interim History: [name] is down 3 lbs since her last visit. She has done well overall and denies any struggles.    Subjective:   Vitamin D deficiency. No nausea, vomiting, or muscle weakness.    Ref. Range 01/06/2020 14:40  Vitamin D, 25-Hydroxy Latest Ref Range: 30.0 - 100.0 ng/mL 27.3 (L)   Elevated LFTs. Tessah has a new dx of elevated ALT. Her BMI is over 40. She denies abdominal pain or jaundice and has never been told of any liver problems in the past. She denies excessive alcohol intake.  Lab Results  Component Value Date   ALT 90 (H) 01/06/2020   AST 83 (H) 01/06/2020   ALKPHOS 89 01/06/2020   BILITOT 0.4 01/06/2020   At risk for impaired function of liver. [name] is at risk for impaired function of liver due to elevated liver enzymes and obesity.   Assessment/Plan:   Vitamin D deficiency. Low Vitamin D level contributes to fatigue and are associated with obesity, breast, and colon cancer. She was given a prescription for Vitamin D, Ergocalciferol, (DRISDOL) 1.25 MG (50000 UNIT) CAPS capsule every week #4 with 0 refills and will follow-up for routine testing of Vitamin D, at least 2-3 times per year to avoid over-replacement.   Elevated LFTs. We discussed the likely diagnosis of non-alcoholic fatty liver disease today and how this condition is obesity related. [name] was educated the importance of weight loss. [name] agreed to continue with  her weight loss efforts with healthier diet and exercise as an essential part of her treatment plan. Will recheck LFT's in approximately 1 month.  At risk for impaired function of liver. [name] was given approximately 15 minutes of counseling today regarding prevention of impaired liver function. [name] was educated about her risk of developing NASH or even liver failure and advised that the only proven treatment for NAFLD was weight loss of at least 5-10% of body weight.   Class 2 severe obesity with serious comorbidity and body mass index (BMI) of 35.0 to 35.9 in adult, unspecified obesity type (HCC).  [name] is currently in the action stage of change. As such, her goal is to continue with weight loss efforts. She has agreed to the Category 2 Plan.   She will work on meal planning and intentional eating.   Exercise goals: [name] will walk in the neighborhood for exercise.  Behavioral modification strategies: increasing lean protein intake, decreasing simple carbohydrates, increasing vegetables, increasing water intake, decreasing eating out, no skipping meals, meal planning and cooking strategies, keeping healthy foods in the home and planning for success.  [name] has agreed to follow-up with our clinic in 2-3 weeks. She was informed of the importance of frequent follow-up visits to maximize her success with intensive lifestyle modifications for her multiple health conditions.   Objective:   Blood pressure 105/71, pulse 80, temperature 98.8 F (37.1 C), height 5\' 3"  (1.6 m), weight 201 lb (91.2 kg), last menstrual  period 09/03/2014, SpO2 97 %. Body mass index is 35.61 kg/m.  General: Cooperative, alert, well developed, in no acute distress. HEENT: Conjunctivae and lids unremarkable. Cardiovascular: Regular rhythm.  Lungs: Normal work of breathing. Neurologic: No focal deficits.   Lab Results  Component Value Date   CREATININE 0.78 01/06/2020   BUN 18 01/06/2020   NA 138 01/06/2020     K 4.3 01/06/2020   CL 98 01/06/2020   CO2 23 01/06/2020   Lab Results  Component Value Date   ALT 90 (H) 01/06/2020   AST 83 (H) 01/06/2020   ALKPHOS 89 01/06/2020   BILITOT 0.4 01/06/2020   Lab Results  Component Value Date   HGBA1C 5.8 (H) 01/06/2020   Lab Results  Component Value Date   INSULIN 32.7 (H) 01/06/2020   Lab Results  Component Value Date   TSH 2.850 01/06/2020   Lab Results  Component Value Date   CHOL 272 (H) 01/06/2020   HDL 49 01/06/2020   LDLCALC 179 (H) 01/06/2020   TRIG 235 (H) 01/06/2020   Lab Results  Component Value Date   WBC 6.7 04/12/2018   HGB 12.8 04/12/2018   HCT 40.3 04/12/2018   MCV 98.5 04/12/2018   PLT 165 04/12/2018   No results found for: IRON, TIBC, FERRITIN  Attestation Statements:   Reviewed by clinician on day of visit: allergies, medications, problem list, medical history, surgical history, family history, social history, and previous encounter notes.  Fernanda Drum, am acting as Energy manager for [employer], DO   I have reviewed the above documentation for accuracy and completeness, and I agree with the above. Corinna Capra, DO

## 2020-02-05 DIAGNOSIS — G4733 Obstructive sleep apnea (adult) (pediatric): Secondary | ICD-10-CM | POA: Diagnosis not present

## 2020-02-10 ENCOUNTER — Other Ambulatory Visit (INDEPENDENT_AMBULATORY_CARE_PROVIDER_SITE_OTHER): Payer: Self-pay | Admitting: Bariatrics

## 2020-02-10 DIAGNOSIS — E559 Vitamin D deficiency, unspecified: Secondary | ICD-10-CM

## 2020-02-11 ENCOUNTER — Other Ambulatory Visit (INDEPENDENT_AMBULATORY_CARE_PROVIDER_SITE_OTHER): Payer: Self-pay | Admitting: Bariatrics

## 2020-02-11 DIAGNOSIS — E559 Vitamin D deficiency, unspecified: Secondary | ICD-10-CM

## 2020-02-25 ENCOUNTER — Other Ambulatory Visit: Payer: Self-pay

## 2020-02-25 ENCOUNTER — Ambulatory Visit (INDEPENDENT_AMBULATORY_CARE_PROVIDER_SITE_OTHER): Payer: BC Managed Care – PPO | Admitting: Bariatrics

## 2020-02-25 ENCOUNTER — Encounter (INDEPENDENT_AMBULATORY_CARE_PROVIDER_SITE_OTHER): Payer: Self-pay | Admitting: Bariatrics

## 2020-02-25 VITALS — BP 114/80 | HR 90 | Temp 99.0°F | Ht 63.0 in | Wt 205.0 lb

## 2020-02-25 DIAGNOSIS — E559 Vitamin D deficiency, unspecified: Secondary | ICD-10-CM

## 2020-02-25 DIAGNOSIS — Z9189 Other specified personal risk factors, not elsewhere classified: Secondary | ICD-10-CM | POA: Diagnosis not present

## 2020-02-25 DIAGNOSIS — R7989 Other specified abnormal findings of blood chemistry: Secondary | ICD-10-CM | POA: Diagnosis not present

## 2020-02-25 DIAGNOSIS — Z6836 Body mass index (BMI) 36.0-36.9, adult: Secondary | ICD-10-CM

## 2020-02-25 MED ORDER — VITAMIN D (ERGOCALCIFEROL) 1.25 MG (50000 UNIT) PO CAPS: 50000.0000 [IU] | ORAL_CAPSULE | ORAL | 0 refills | Status: DC

## 2020-02-26 ENCOUNTER — Encounter (INDEPENDENT_AMBULATORY_CARE_PROVIDER_SITE_OTHER): Payer: Self-pay | Admitting: Bariatrics

## 2020-02-26 LAB — COMPREHENSIVE METABOLIC PANEL
ALT: 45 IU/L — ABNORMAL HIGH (ref 0–32)
AST: 30 IU/L (ref 0–40)
Albumin/Globulin Ratio: 2.1 (ref 1.2–2.2)
Albumin: 4.8 g/dL (ref 3.8–4.9)
Alkaline Phosphatase: 77 IU/L (ref 48–121)
BUN/Creatinine Ratio: 23 (ref 9–23)
BUN: 17 mg/dL (ref 6–24)
Bilirubin Total: 0.5 mg/dL (ref 0.0–1.2)
CO2: 24 mmol/L (ref 20–29)
Calcium: 9.4 mg/dL (ref 8.7–10.2)
Chloride: 102 mmol/L (ref 96–106)
Creatinine, Ser: 0.73 mg/dL (ref 0.57–1.00)
GFR calc Af Amer: 109 mL/min/{1.73_m2} (ref >59)
GFR calc non Af Amer: 94 mL/min/{1.73_m2} (ref >59)
Globulin, Total: 2.3 g/dL (ref 1.5–4.5)
Glucose: 97 mg/dL (ref 65–99)
Potassium: 3.9 mmol/L (ref 3.5–5.2)
Sodium: 139 mmol/L (ref 134–144)
Total Protein: 7.1 g/dL (ref 6.0–8.5)

## 2020-02-26 NOTE — Progress Notes (Signed)
Chief Complaint:   OBESITY Gina Norman is here to discuss her progress with her obesity treatment plan along with follow-up of her obesity related diagnoses. Gina Norman is on the Category 2 Plan and states she is following her eating plan approximately 80% of the time. Gina Norman states she is walking 10,000 steps minutes 5 times per week.  Today's visit was #: 4 Starting weight: 209 lbs Starting date: 01/06/2020 Today's weight: 205 lbs Today's date: 02/25/2020 Total lbs lost to date: 4 Total lbs lost since last in-office visit: 0  Interim History: Gina Norman is up 4 lbs from her last visit. She has had some celebrations. She reports she is drinking more water.  Subjective:   Vitamin D deficiency. No nausea, vomiting, or muscle weakness.    Ref. Range 01/06/2020 14:40  Vitamin D, 25-Hydroxy Latest Ref Range: 30.0 - 100.0 ng/mL 27.3 (L)   Elevated LFTs. Gina Norman has a new dx of elevated ALT. Her BMI is over 40. She denies abdominal pain. She denies excessive alcohol intake.   Ref. Range 01/06/2020 14:40  AST Latest Ref Range: 0 - 40 IU/L 83 (H)  ALT Latest Ref Range: 0 - 32 IU/L 90 (H)   At risk for osteoporosis. Gina Norman is at higher risk of osteopenia and osteoporosis due to Vitamin D deficiency.   Assessment/Plan:   Vitamin D deficiency. Low Vitamin D level contributes to fatigue and are associated with obesity, breast, and colon cancer. She was given a prescription for Vitamin D, Ergocalciferol, (DRISDOL) 1.25 MG (50000 UNIT) CAPS capsule every week #4 with 0 refills and will follow-up for routine testing of Vitamin D, at least 2-3 times per year to avoid over-replacement.   Elevated LFTs. We discussed the likely diagnosis of non-alcoholic fatty liver disease today and how this condition is obesity related. Reha was educated the importance of weight loss. Gina Norman agreed to continue with her weight loss efforts with healthier diet and exercise as an essential part of her treatment plan.  Comprehensive metabolic panel will be checked today.  At risk for osteoporosis. Gina Norman was given approximately 15 minutes of osteoporosis prevention counseling today. Gina Norman is at risk for osteopenia and osteoporosis due to her Vitamin D deficiency. She was encouraged to take her Vitamin D and follow her higher calcium diet and increase strengthening exercise to help strengthen her bones and decrease her risk of osteopenia and osteoporosis.  Repetitive spaced learning was employed today to elicit superior memory formation and behavioral change.  Class 2 severe obesity with serious comorbidity and body mass index (BMI) of 36.0 to 36.9 in adult, unspecified obesity type (HCC).  Gina Norman is currently in the action stage of change. As such, her goal is to continue with weight loss efforts. She has agreed to the Category 2 Plan.   She will work on meal planning and intentional eating.   Handout was provided on "[employer] Road."  Exercise goals: Gina Norman will continue her current exercise regimen.   Behavioral modification strategies: increasing lean protein intake, decreasing simple carbohydrates, increasing vegetables, increasing water intake, decreasing eating out, no skipping meals, meal planning and cooking strategies, keeping healthy foods in the home and planning for success.  Gina Norman has agreed to follow-up with our clinic in 2-3 weeks. She was informed of the importance of frequent follow-up visits to maximize her success with intensive lifestyle modifications for her multiple health conditions.   Gina Norman was informed we would discuss her lab results at her next visit unless there is  a critical issue that needs to be addressed sooner. Gina Norman agreed to keep her next visit at the agreed upon time to discuss these results.  Objective:   Blood pressure 114/80, pulse 90, temperature 99 F (37.2 C), height 5\' 3"  (1.6 m), weight 205 lb (93 kg), last menstrual period 09/03/2014, SpO2 97 %. Body mass  index is 36.31 kg/m.  General: Cooperative, alert, well developed, in no acute distress. HEENT: Conjunctivae and lids unremarkable. Cardiovascular: Regular rhythm.  Lungs: Normal work of breathing. Neurologic: No focal deficits.   Lab Results  Component Value Date   CREATININE 0.73 02/25/2020   BUN 17 02/25/2020   NA 139 02/25/2020   K 3.9 02/25/2020   CL 102 02/25/2020   CO2 24 02/25/2020   Lab Results  Component Value Date   ALT 45 (H) 02/25/2020   AST 30 02/25/2020   ALKPHOS 77 02/25/2020   BILITOT 0.5 02/25/2020   Lab Results  Component Value Date   HGBA1C 5.8 (H) 01/06/2020   Lab Results  Component Value Date   INSULIN 32.7 (H) 01/06/2020   Lab Results  Component Value Date   TSH 2.850 01/06/2020   Lab Results  Component Value Date   CHOL 272 (H) 01/06/2020   HDL 49 01/06/2020   LDLCALC 179 (H) 01/06/2020   TRIG 235 (H) 01/06/2020   Lab Results  Component Value Date   WBC 6.7 04/12/2018   HGB 12.8 04/12/2018   HCT 40.3 04/12/2018   MCV 98.5 04/12/2018   PLT 165 04/12/2018   No results found for: IRON, TIBC, FERRITIN  Attestation Statements:   Reviewed by clinician on day of visit: allergies, medications, problem list, medical history, surgical history, family history, social history, and previous encounter notes.  06/12/2018, am acting as Fernanda Drum for Energy manager, DO   I have reviewed the above documentation for accuracy and completeness, and I agree with the above. [employer], DO

## 2020-02-26 NOTE — Progress Notes (Signed)
Pt was advised.

## 2020-03-07 DIAGNOSIS — G4733 Obstructive sleep apnea (adult) (pediatric): Secondary | ICD-10-CM | POA: Diagnosis not present

## 2020-03-08 ENCOUNTER — Other Ambulatory Visit (INDEPENDENT_AMBULATORY_CARE_PROVIDER_SITE_OTHER): Payer: Self-pay | Admitting: Bariatrics

## 2020-03-08 DIAGNOSIS — E559 Vitamin D deficiency, unspecified: Secondary | ICD-10-CM

## 2020-03-10 ENCOUNTER — Other Ambulatory Visit: Payer: Self-pay

## 2020-03-10 ENCOUNTER — Ambulatory Visit (INDEPENDENT_AMBULATORY_CARE_PROVIDER_SITE_OTHER): Payer: BC Managed Care – PPO | Admitting: Bariatrics

## 2020-03-10 ENCOUNTER — Encounter (INDEPENDENT_AMBULATORY_CARE_PROVIDER_SITE_OTHER): Payer: Self-pay | Admitting: Bariatrics

## 2020-03-10 VITALS — BP 114/79 | HR 90 | Temp 98.0°F | Ht 63.0 in | Wt 203.0 lb

## 2020-03-10 DIAGNOSIS — E559 Vitamin D deficiency, unspecified: Secondary | ICD-10-CM

## 2020-03-10 DIAGNOSIS — R7989 Other specified abnormal findings of blood chemistry: Secondary | ICD-10-CM

## 2020-03-10 DIAGNOSIS — I10 Essential (primary) hypertension: Secondary | ICD-10-CM | POA: Diagnosis not present

## 2020-03-10 DIAGNOSIS — Z9189 Other specified personal risk factors, not elsewhere classified: Secondary | ICD-10-CM

## 2020-03-10 DIAGNOSIS — E781 Pure hyperglyceridemia: Secondary | ICD-10-CM

## 2020-03-10 DIAGNOSIS — Z6836 Body mass index (BMI) 36.0-36.9, adult: Secondary | ICD-10-CM

## 2020-03-10 MED ORDER — VITAMIN D (ERGOCALCIFEROL) 1.25 MG (50000 UNIT) PO CAPS: 50000.0000 [IU] | ORAL_CAPSULE | ORAL | 0 refills | Status: DC

## 2020-03-10 NOTE — Progress Notes (Signed)
Chief Complaint:   OBESITY Gina Norman is here to discuss her progress with her obesity treatment plan along with follow-up of her obesity related diagnoses. [name] is on the Category 2 Plan and states she is following her eating plan approximately 95% of the time. [name] states she is walking 6,000 steps 7 times per week and doing yoga 15-20 minutes 2 times per week.  Today's visit was #: 5 Starting weight: 209 lbs Starting date: 01/06/2020 Today's weight: 203 lbs Today's date: 03/10/2020 Total lbs lost to date: 6 Total lbs lost since last in-office visit: 2  Interim History: [name] is down 2 lbs. She is going through a "birthday session." She has been using "My Fitness Pal."  Subjective:   Essential hypertension. [name] is taking Zestoretic and Toprol XL.  BP Readings from Last 3 Encounters:  03/10/20 114/79  02/25/20 114/80  02/04/20 105/71   Lab Results  Component Value Date   CREATININE 0.73 02/25/2020   CREATININE 0.78 01/06/2020   CREATININE 0.83 04/12/2018   Hypertriglyceridemia with hyperlipidemia. [name] is taking Lovaza and Zetia. Triglycerides remain uncontrolled.   Ref. Range 01/06/2020 14:40  Triglycerides Latest Ref Range: 0 - 149 mg/dL 409 (H)   Vitamin D deficiency. No nausea, vomiting, or muscle weakness.    Ref. Range 01/06/2020 14:40  Vitamin D, 25-Hydroxy Latest Ref Range: 30.0 - 100.0 ng/mL 27.3 (L)   Elevated LFTs. [name] has a new dx of elevated ALT. She denies abdominal pain. She denies excessive alcohol intake. ALT was 90 and is now down to 45.  Lab Results  Component Value Date   ALT 45 (H) 02/25/2020   AST 30 02/25/2020   ALKPHOS 77 02/25/2020   BILITOT 0.5 02/25/2020   At risk for heart disease. [name] is at a higher than average risk for cardiovascular disease due to hypertension and elevated triglycerides.   Assessment/Plan:   Essential hypertension. Ivyrose is working on healthy weight loss and exercise to improve blood  pressure control. We will watch for signs of hypotension as she continues her lifestyle modifications. She will continue her medications as directed.   Hypertriglyceridemia with hyperlipidemia. [name] will continue her medications as directed and follow-up wit her PCP as scheduled.  Vitamin D deficiency. Low Vitamin D level contributes to fatigue and are associated with obesity, breast, and colon cancer. She was given a prescription for Vitamin D, Ergocalciferol, (DRISDOL) 1.25 MG (50000 UNIT) CAPS capsule every week #4 with 0 refills and will follow-up for routine testing of Vitamin D, at least 2-3 times per year to avoid over-replacement.   Elevated LFTs. We discussed the likely diagnosis of non-alcoholic fatty liver disease today and how this condition is obesity related. [name] was educated the importance of weight loss. [name] agreed to continue with her weight loss efforts with healthier diet and exercise as an essential part of her treatment plan. Will recheck levels in one month.  At risk for heart disease. [name] was given approximately 15 minutes of coronary artery disease prevention counseling today. She is 54 y.o. female and has risk factors for heart disease including obesity. We discussed intensive lifestyle modifications today with an emphasis on specific weight loss instructions and strategies.   Repetitive spaced learning was employed today to elicit superior memory formation and behavioral change.  Class 2 severe obesity with serious comorbidity and body mass index (BMI) of 36.0 to 36.9 in adult, unspecified obesity type (HCC).  [name] is currently in the action stage of change. As  such, her goal is to continue with weight loss efforts. She has agreed to the Category 2 Plan.   She will work on meal planning, mindful eating, and will continue using "My Fitness Pal."  Handout was provided on Recipes.  Exercise goals: All adults should avoid inactivity. Some physical activity is  better than none, and adults who participate in any amount of physical activity gain some health benefits.  Behavioral modification strategies: increasing lean protein intake, decreasing simple carbohydrates, increasing vegetables, increasing water intake, decreasing eating out, no skipping meals, meal planning and cooking strategies, keeping healthy foods in the home and planning for success.  [name] has agreed to follow-up with our clinic in 2-3 weeks. She was informed of the importance of frequent follow-up visits to maximize her success with intensive lifestyle modifications for her multiple health conditions.   Objective:   Blood pressure 114/79, pulse 90, temperature 98 F (36.7 C), height 5\' 3"  (1.6 m), weight 203 lb (92.1 kg), last menstrual period 09/03/2014, SpO2 96 %. Body mass index is 35.96 kg/m.  General: Cooperative, alert, well developed, in no acute distress. HEENT: Conjunctivae and lids unremarkable. Cardiovascular: Regular rhythm.  Lungs: Normal work of breathing. Neurologic: No focal deficits.   Lab Results  Component Value Date   CREATININE 0.73 02/25/2020   BUN 17 02/25/2020   NA 139 02/25/2020   K 3.9 02/25/2020   CL 102 02/25/2020   CO2 24 02/25/2020   Lab Results  Component Value Date   ALT 45 (H) 02/25/2020   AST 30 02/25/2020   ALKPHOS 77 02/25/2020   BILITOT 0.5 02/25/2020   Lab Results  Component Value Date   HGBA1C 5.8 (H) 01/06/2020   Lab Results  Component Value Date   INSULIN 32.7 (H) 01/06/2020   Lab Results  Component Value Date   TSH 2.850 01/06/2020   Lab Results  Component Value Date   CHOL 272 (H) 01/06/2020   HDL 49 01/06/2020   LDLCALC 179 (H) 01/06/2020   TRIG 235 (H) 01/06/2020   Lab Results  Component Value Date   WBC 6.7 04/12/2018   HGB 12.8 04/12/2018   HCT 40.3 04/12/2018   MCV 98.5 04/12/2018   PLT 165 04/12/2018   No results found for: IRON, TIBC, FERRITIN  Attestation Statements:   Reviewed by  clinician on day of visit: allergies, medications, problem list, medical history, surgical history, family history, social history, and previous encounter notes.  06/12/2018, am acting as Fernanda Drum for Energy manager, DO   I have reviewed the above documentation for accuracy and completeness, and I agree with the above. [employer], DO

## 2020-03-15 ENCOUNTER — Other Ambulatory Visit: Payer: Self-pay | Admitting: Internal Medicine

## 2020-03-17 DIAGNOSIS — D2272 Melanocytic nevi of left lower limb, including hip: Secondary | ICD-10-CM | POA: Diagnosis not present

## 2020-03-17 DIAGNOSIS — D2262 Melanocytic nevi of left upper limb, including shoulder: Secondary | ICD-10-CM | POA: Diagnosis not present

## 2020-03-17 DIAGNOSIS — D225 Melanocytic nevi of trunk: Secondary | ICD-10-CM | POA: Diagnosis not present

## 2020-03-17 DIAGNOSIS — D485 Neoplasm of uncertain behavior of skin: Secondary | ICD-10-CM | POA: Diagnosis not present

## 2020-03-17 DIAGNOSIS — D2261 Melanocytic nevi of right upper limb, including shoulder: Secondary | ICD-10-CM | POA: Diagnosis not present

## 2020-03-18 DIAGNOSIS — G4733 Obstructive sleep apnea (adult) (pediatric): Secondary | ICD-10-CM | POA: Diagnosis not present

## 2020-03-18 NOTE — Progress Notes (Signed)
Name: Gina Norman  MRN/ DOB: [id number], January 07, 1966    Age/ Sex: 54 y.o., female     PCP: Clovis Riley, L.August Saucer, MD   Reason for Endocrinology Evaluation: PCOS     Initial Endocrinology Clinic Visit: 09/18/2019    PATIENT IDENTIFIER: Gina Norman is a 54 y.o., female with a past medical history of PCOS, Dyslipidemia and obesity . She has followed with [facility] since 09/18/2019 for consultative assistance with management of her PCOS  HISTORICAL SUMMARY:   [name] has been diagnosed with PCOS over 20 years ago, historically she has had irregular menstruations requiring OCPs.  She has spent most of her life on OCPs, until 2017 she got scared of them and had to stop them as her aunt was diagnosed with thromboembolic event.  she did have to see an infertility specialist while trying to conceive, requiring hysteroscopies.  She has 2 sons the youngest was born in 2010.  She had gestational diabetes during her pregnancies.  She has not had any menstruation since 2017    Patient has hypertriglyceridemia with TG levels  > 400 mg/dL in 07/8561.  She was started on ezetimibe at the time with repeat TG and 1/21 at 350 mg/DL.  Patient does have fish oil but she forgets to take it.  Patient denies hirsutism, she does have a few dark hairs on the chin that she is able to plug when needed.  SUBJECTIVE:    Today (03/19/2020):  [name] is here for a follow up on PCOS.   Denies sob  Denies constipation or diarrhea    Metformin 500 mg daily  Crestor 5 mg three times a week  Zetia 10 mg  Daily  Fish oil 1650 mg BID     Follows with [facility]   Eating 1200 kcal /day  Unable to exercise to  back issues      HISTORY:  Past Medical History:  Past Medical History:  Diagnosis Date  . ADD (attention deficit disorder)   . Back pain   . Back pain   . Complication of anesthesia    hard to wake up  . DDD (degenerative disc disease),  lumbosacral   . Female infertility   . High cholesterol   . Hypertension   . Joint pain   . PCOS (polycystic ovarian syndrome)   . Pre-diabetes   . Sleep apnea   . Wears contact lenses    Past Surgical History:  Past Surgical History:  Procedure Laterality Date  . CARPAL TUNNEL RELEASE Left 08/14/2014   Procedure: LEFT CARPAL TUNNEL RELEASE;  Surgeon: Cindee Salt, MD;  Location: [facility];  Service: Orthopedics;  Laterality: Left;  [facility]  . CARPAL TUNNEL RELEASE Right 09/23/2014   Procedure: RIGHT CARPAL TUNNEL RELEASE;  Surgeon: Cindee Salt, MD;  Location: [facility];  Service: Orthopedics;  Laterality: Right;  . ORIF ANKLE FRACTURE Left 04/12/2018   Procedure: OPEN REDUCTION INTERNAL FIXATION (ORIF) ANKLE FRACTURE;  Surgeon: Terance Hart, MD;  Location: [facility] OR;  Service: Orthopedics;  Laterality: Left;  . TONSILLECTOMY      Social History:  reports that she quit smoking about 12 years ago. She has a 5.00 pack-year smoking history. She has never used smokeless tobacco. She reports current alcohol use. She reports that she does not use drugs. Family History:  Family History  Problem Relation Age of Onset  . Cancer Maternal Aunt  gallbladder cancer  . Cancer Paternal Grandmother [id number]       breast cancer   . Cancer Cousin        endometrial cancer  . Cancer Cousin        breast cancer  . Cancer Cousin        kidney cancer  . Hypertension Mother   . High Cholesterol Mother   . Diabetes Father   . High blood pressure Father   . High Cholesterol Father   . Sudden death Father      HOME MEDICATIONS: Allergies as of 03/19/2020      Reactions   Erythromycin Nausea Only   Upset stomach       Medication List       Accurate as of March 19, 2020  9:21 AM. If you have any questions, ask your nurse or doctor.        Aleve 220 MG Caps Generic drug: Naproxen Sodium Aleve   amphetamine-dextroamphetamine 15 MG  tablet Commonly known as: ADDERALL Take 15 mg by mouth every morning.   cetirizine 10 MG tablet Commonly known as: ZYRTEC Take 10 mg by mouth daily.   ezetimibe 10 MG tablet Commonly known as: ZETIA Take 10 mg by mouth daily.   gabapentin 300 MG capsule Commonly known as: NEURONTIN Take 300-600 mg by mouth See admin instructions. 300 mg in the morning, 600 mg at bedtime   lisinopril-hydrochlorothiazide 10-12.5 MG tablet Commonly known as: ZESTORETIC Take 1 tablet by mouth daily.   Melatonin 2.5 MG Caps Take by mouth.   metFORMIN 500 MG 24 hr tablet Commonly known as: GLUCOPHAGE-XR TAKE 1 TABLET (500 MG TOTAL) BY MOUTH IN THE MORNING AND AT BEDTIME.   metoprolol succinate 50 MG 24 hr tablet Commonly known as: TOPROL-XL Take 50 mg by mouth daily.   montelukast 10 MG tablet Commonly known as: SINGULAIR Take 10 mg by mouth at bedtime.   omega-3 acid ethyl esters 1 g capsule Commonly known as: LOVAZA Take by mouth 2 (two) times daily.   PROBIOTIC DAILY PO Take 1 capsule by mouth at bedtime.   rosuvastatin 5 MG tablet Commonly known as: CRESTOR Take 5 mg by mouth daily.   Turmeric 450 MG Caps Take 1 capsule by mouth daily.   Vitamin D (Ergocalciferol) 1.25 MG (50000 UNIT) Caps capsule Commonly known as: DRISDOL Take 1 capsule (50,000 Units total) by mouth every 7 (seven) days.         OBJECTIVE:   PHYSICAL EXAM: VS: BP 110/60   Pulse 78   Ht 5\' 3"  (1.6 m)   Wt 207 lb (93.9 kg)   LMP 09/03/2014   BMI 36.67 kg/m    EXAM: General: Pt appears well and is in NAD  Neck: General: Supple without adenopathy. Thyroid: Thyroid size normal.  Left thyroid asymmetry noted  Lungs: Clear with good BS bilat with no rales, rhonchi, or wheezes  Heart: Auscultation: RRR.  Abdomen: Normoactive bowel sounds, soft, nontender, without masses or organomegaly palpable  Extremities:  BL LE: No pretibial edema normal ROM and strength.  Mental Status: Judgment, insight:  Intact Orientation: Oriented to time, place, and person Mood and affect: No depression, anxiety, or agitation     DATA REVIEWED:  Results for Wendee BeaversKRESS, Journiee L (MRN [id number]) as of 03/19/2020 10:18  Ref. Range 02/25/2020 09:54  Sodium Latest Ref Range: 134 - 144 mmol/L 139  Potassium Latest Ref Range: 3.5 - 5.2 mmol/L 3.9  Chloride Latest Ref Range: 96 - 106  mmol/L 102  CO2 Latest Ref Range: 20 - 29 mmol/L 24  Glucose Latest Ref Range: 65 - 99 mg/dL 97  BUN Latest Ref Range: 6 - 24 mg/dL 17  Creatinine Latest Ref Range: 0.57 - 1.00 mg/dL 1.44  Calcium Latest Ref Range: 8.7 - 10.2 mg/dL 9.4  BUN/Creatinine Ratio Latest Ref Range: 9 - 23  23  Alkaline Phosphatase Latest Ref Range: 48 - 121 IU/L 77  Albumin Latest Ref Range: 3.8 - 4.9 g/dL 4.8  Albumin/Globulin Ratio Latest Ref Range: 1.2 - 2.2  2.1  AST Latest Ref Range: 0 - 40 IU/L 30  ALT Latest Ref Range: 0 - 32 IU/L 45 (H)  Total Protein Latest Ref Range: 6.0 - 8.5 g/dL 7.1  Total Bilirubin Latest Ref Range: 0.0 - 1.2 mg/dL 0.5  GFR, Est Non African American Latest Ref Range: >59 mL/min/1.73 94  GFR, Est African American Latest Ref Range: >59 mL/min/1.73 109   Results for MARRIAN, BELLS (MRN [id number]) as of 03/19/2020 10:18  Ref. Range 01/06/2020 14:40  Cholesterol, Total Latest Ref Range: 100 - 199 mg/dL 702 (H)  HDL Cholesterol Latest Ref Range: >39 mg/dL 49  LDL/HDL Ratio Latest Ref Range: 0.0 - 3.2 ratio 3.7 (H)  Triglycerides Latest Ref Range: 0 - 149 mg/dL 637 (H)  VLDL Cholesterol Cal Latest Ref Range: 5 - 40 mg/dL 44 (H)  LDL Chol Calc (NIH) Latest Ref Range: 0 - 99 mg/dL 858 (H)    ASSESSMENT / PLAN / RECOMMENDATIONS:   1. Polycystic Ovary Syndrome ( PCOS)    After menopause the two key diagnostic criteria of PCOS (irregular menses and polycystic ovaries on transvaginal ultrasound) are no longer applicable, because menopausal women have amenorrhea.  -The main issue with PCOS in the postmenopausal years, is  hyperandrogenism , which she has no symptoms or sign of at this time. -Postmenopausal woman with PCOS are at a higher risk for developing type 2 diabetes, cardiovascular disease, and other metabolic disorders.  Our focus moving forward should be on her metabolic syndromes.  2.  Impaired fasting glucose:  -Patient is at high risk for developing type 2 diabetes given her hx of  impaired fasting glucose, and history of gestational diabetes in the past. -She has been following up with the [facility]  - She has not been able to tolerate higher doses of metformin, currently on 1 tabl daily but she will try increasing it to 2 tabs daily     Medications : Metformin 500 mg XR, 2 tabs daily   3.  Hypertriglyceridemia:  - Tg are improving. She has been more compliant with fish oil intake. She is now on Rosuvastatin daily as well as zetia.  - We discussed the importance of low carb and low fat diet   4. Thyromegaly:  - Questionable left thyroid nodule. Will proceed with thyroid ultrasound      Medications  Rosuvastatin 5 mg daily  Ezetimibe 10 mg daily  Fish oil 1650 mg BID      F/U in 1 yr  Signed electronically by: Lyndle Herrlich, MD  [facility]  [facility] [address] [place] [place], [place] [address] Phone: [phone] FAX: [phone]      CC: Asencion Gowda.August Saucer, MD [address] [place] [address] Phone: [phone]  Fax: [phone]   Return to Endocrinology clinic as below: Future Appointments  Date Time Provider Department Center  03/31/2020 10:20 AM Roswell Nickel, DO [facility]  None  03/29/2021  7:30 AM Jovi Alvizo, Konrad Dolores, MD [facility] None

## 2020-03-19 ENCOUNTER — Ambulatory Visit (INDEPENDENT_AMBULATORY_CARE_PROVIDER_SITE_OTHER): Payer: BC Managed Care – PPO | Admitting: Internal Medicine

## 2020-03-19 ENCOUNTER — Encounter: Payer: Self-pay | Admitting: Internal Medicine

## 2020-03-19 ENCOUNTER — Other Ambulatory Visit: Payer: Self-pay

## 2020-03-19 VITALS — BP 110/60 | HR 78 | Ht 63.0 in | Wt 207.0 lb

## 2020-03-19 DIAGNOSIS — E010 Iodine-deficiency related diffuse (endemic) goiter: Secondary | ICD-10-CM

## 2020-03-19 DIAGNOSIS — E781 Pure hyperglyceridemia: Secondary | ICD-10-CM

## 2020-03-19 DIAGNOSIS — R7301 Impaired fasting glucose: Secondary | ICD-10-CM

## 2020-03-19 DIAGNOSIS — E282 Polycystic ovarian syndrome: Secondary | ICD-10-CM | POA: Diagnosis not present

## 2020-03-19 NOTE — Patient Instructions (Signed)
-   Try to increase Metformin 500 mg XR to TWO tablets at night  - Continue Rosuvastatin ( Crestor) 5 mg daily  - Continue Zetia 10 mg daily  - Continue Fish oil 1650 mg Twice a day

## 2020-03-23 DIAGNOSIS — Z1159 Encounter for screening for other viral diseases: Secondary | ICD-10-CM | POA: Diagnosis not present

## 2020-03-24 ENCOUNTER — Ambulatory Visit
Admission: RE | Admit: 2020-03-24 | Discharge: 2020-03-24 | Disposition: A | Discharge status: Home / Self Care | Payer: BC Managed Care – PPO | Source: Ambulatory Visit | Attending: Internal Medicine | Admitting: Internal Medicine

## 2020-03-24 DIAGNOSIS — E010 Iodine-deficiency related diffuse (endemic) goiter: Secondary | ICD-10-CM

## 2020-03-25 DIAGNOSIS — Z1211 Encounter for screening for malignant neoplasm of colon: Secondary | ICD-10-CM | POA: Diagnosis not present

## 2020-03-25 DIAGNOSIS — K621 Rectal polyp: Secondary | ICD-10-CM | POA: Diagnosis not present

## 2020-03-25 DIAGNOSIS — K5730 Diverticulosis of large intestine without perforation or abscess without bleeding: Secondary | ICD-10-CM | POA: Diagnosis not present

## 2020-03-25 DIAGNOSIS — Z1509 Genetic susceptibility to other malignant neoplasm: Secondary | ICD-10-CM | POA: Diagnosis not present

## 2020-03-30 ENCOUNTER — Telehealth: Payer: Self-pay | Admitting: Internal Medicine

## 2020-03-30 NOTE — Telephone Encounter (Signed)
  Discussed ultrasound results with the pt as below  But in review of the images, it looks like she had a isthmic/left lobe nodule as well as an isthmic nodule Spoke to Radiologist about this but he believes its a heterogeneity rather then an actual nodule at the isthmic left lobe junction.   I have advised the pt to schedule a 6 month follow up   Reviewed TFT results being normal. She is concerned about inability to lose weight but I again assured her of normal thyroid and discussed genetic causes for weight        Nodule # 1:  Location: Right; Superior  Maximum size: 2.3 cm; Other 2 dimensions: 1.9 x 1.3 cm  Composition: cannot determine (2)  Echogenicity: cannot determine (1)  Shape: not taller-than-wide (0)  Margins: ill-defined (0)  Echogenic foci: none (0)  ACR TI-RADS total points: 3.  ACR TI-RADS risk category: TR3 (3 points).  ACR TI-RADS recommendations:  *Given size (>/= 1.5 - 2.4 cm) and appearance, a follow-up ultrasound in 1 year should be considered based on TI-RADS criteria.  _________________________________________________________  Nodule # 2:  Location: Left; Superior  Maximum size: 2.3 cm; Other 2 dimensions: 1.6 x 1.4 cm  Composition: cannot determine (2)  Echogenicity: cannot determine (1)  Shape: not taller-than-wide (0)  Margins: ill-defined (0)  Echogenic foci: none (0)  ACR TI-RADS total points: 3.  ACR TI-RADS risk category: TR3 (3 points).  ACR TI-RADS recommendations:  *Given size (>/= 1.5 - 2.4 cm) and appearance, a follow-up ultrasound in 1 year should be considered based on TI-RADS criteria.  _________________________________________________________  There is marked heterogeneity and enlargement of the left inferior thyroid lobe without discrete nodule. Within the posteromedial aspect of the superior to midportion of the left thyroid lobe are several mostly hypoechoic structures measuring  approximately 1 cm. These are favored to represent lymph nodes with normal morphology, parathyroid glands, or combination there of and do not require dedicated follow up.  IMPRESSION: 1. Multinodular goiter and marked heterogeneity of the thyroid parenchyma. 2. Right (#1) and left (#2) superior thyroid nodules meet criteria for 1 year ultrasound follow-up.

## 2020-03-31 ENCOUNTER — Other Ambulatory Visit: Payer: Self-pay

## 2020-03-31 ENCOUNTER — Ambulatory Visit (INDEPENDENT_AMBULATORY_CARE_PROVIDER_SITE_OTHER): Payer: BC Managed Care – PPO | Admitting: Bariatrics

## 2020-03-31 ENCOUNTER — Encounter (INDEPENDENT_AMBULATORY_CARE_PROVIDER_SITE_OTHER): Payer: Self-pay | Admitting: Bariatrics

## 2020-03-31 VITALS — BP 122/76 | HR 97 | Temp 98.0°F | Ht 63.0 in | Wt 200.0 lb

## 2020-03-31 DIAGNOSIS — R7989 Other specified abnormal findings of blood chemistry: Secondary | ICD-10-CM

## 2020-03-31 DIAGNOSIS — Z6835 Body mass index (BMI) 35.0-35.9, adult: Secondary | ICD-10-CM

## 2020-03-31 DIAGNOSIS — E559 Vitamin D deficiency, unspecified: Secondary | ICD-10-CM

## 2020-03-31 DIAGNOSIS — E282 Polycystic ovarian syndrome: Secondary | ICD-10-CM | POA: Diagnosis not present

## 2020-03-31 DIAGNOSIS — Z9189 Other specified personal risk factors, not elsewhere classified: Secondary | ICD-10-CM | POA: Diagnosis not present

## 2020-03-31 MED ORDER — VITAMIN D (ERGOCALCIFEROL) 1.25 MG (50000 UNIT) PO CAPS: 50000.0000 [IU] | ORAL_CAPSULE | ORAL | 0 refills | Status: DC

## 2020-04-02 ENCOUNTER — Encounter (INDEPENDENT_AMBULATORY_CARE_PROVIDER_SITE_OTHER): Payer: Self-pay | Admitting: Bariatrics

## 2020-04-02 NOTE — Progress Notes (Signed)
Chief Complaint:   OBESITY Gina Norman is here to discuss her progress with her obesity treatment plan along with follow-up of her obesity related diagnoses. Gina Norman is on the Category 2 Plan and states she is following her eating plan approximately 75% of the time. Gina Norman states she is walking 8,000 steps daily and walking in place 10-15 minutes 3-4 times per week.  Today's visit was #: 6 Starting weight: 209 lbs Starting date: 01/06/2020 Today's weight: 200 lbs Today's date: 03/31/2020 Total lbs lost to date: 9 Total lbs lost since last in-office visit: 3  Interim History: Gina Norman is down 3 lbs since her last visit. She had a colonoscopy and states that she had to eat differently for 5 days or a week.  Subjective:   Vitamin D deficiency. Gina Norman reports minimal sunlight exposure.   Ref. Range 01/06/2020 14:40  Vitamin D, 25-Hydroxy Latest Ref Range: 30.0 - 100.0 ng/mL 27.3 (L)   Elevated LFTs. Gina Norman has a new dx of elevated ALT. Her BMI is over 40. She denies abdominal pain or jaundice and has never been told of any liver problems in the past. She denies excessive alcohol intake. Last liver enzymes were reasonably normal, however, ALT was still slightly elevated.  Lab Results  Component Value Date   ALT 45 (H) 02/25/2020   AST 30 02/25/2020   ALKPHOS 77 02/25/2020   BILITOT 0.5 02/25/2020   PCOS (polycystic ovarian syndrome). Gina Norman is taking metformin. She does see Endocrinology.  At risk for impaired function of liver. Gina Norman is at risk for impaired function of liver due to elevated liver function tests.  Assessment/Plan:   Vitamin D deficiency. Low Vitamin D level contributes to fatigue and are associated with obesity, breast, and colon cancer. She was given a prescription for Vitamin D, Ergocalciferol, (DRISDOL) 1.25 MG (50000 UNIT) CAPS capsule every week #4 with 0 refills and will follow-up for routine testing of Vitamin D, at least 2-3 times per year to avoid  over-replacement.   Elevated LFTs. We discussed the likely diagnosis of non-alcoholic fatty liver disease today and how this condition is obesity related. Gina Norman was educated the importance of weight loss. Gina Norman agreed to continue with her weight loss efforts with healthier diet and exercise as an essential part of her treatment plan. Will continue to watch over time.  PCOS (polycystic ovarian syndrome). Gina Norman will follow-up with her endocrinologist as scheduled and as directed.   At risk for impaired function of liver. Gina Norman was given approximately 15 minutes of counseling today regarding prevention of impaired liver function. Gina Norman was educated about her risk of developing NASH or even liver failure and advised that the only proven treatment for NAFLD was weight loss of at least 5-10% of body weight. Will watch over time. She will avoid Tylenol and have limited ETOH.  Class 2 severe obesity with serious comorbidity and body mass index (BMI) of 35.0 to 35.9 in adult, unspecified obesity type (HCC).  Gina Norman is currently in the action stage of change. As such, her goal is to continue with weight loss efforts. She has agreed to the Category 2 Plan.   She will work on meal planning, intentional eating, and being more adherent to the plan.  Exercise goals: Gina Norman will continue to walk for exercise.  Behavioral modification strategies: increasing lean protein intake, decreasing simple carbohydrates, increasing vegetables, increasing water intake, decreasing eating out, no skipping meals, meal planning and cooking strategies, keeping healthy foods in the home and planning  for success.  Gina Norman has agreed to follow-up with our clinic in 3-4 weeks. She was informed of the importance of frequent follow-up visits to maximize her success with intensive lifestyle modifications for her multiple health conditions.   Objective:   Blood pressure 122/76, pulse 97, temperature 98 F (36.7 C), height 5\' 3"   (1.6 m), weight 200 lb (90.7 kg), last menstrual period 09/03/2014, SpO2 100 %. Body mass index is 35.43 kg/m.  General: Cooperative, alert, well developed, in no acute distress. HEENT: Conjunctivae and lids unremarkable. Cardiovascular: Regular rhythm.  Lungs: Normal work of breathing. Neurologic: No focal deficits.   Lab Results  Component Value Date   CREATININE 0.73 02/25/2020   BUN 17 02/25/2020   NA 139 02/25/2020   K 3.9 02/25/2020   CL 102 02/25/2020   CO2 24 02/25/2020   Lab Results  Component Value Date   ALT 45 (H) 02/25/2020   AST 30 02/25/2020   ALKPHOS 77 02/25/2020   BILITOT 0.5 02/25/2020   Lab Results  Component Value Date   HGBA1C 5.8 (H) 01/06/2020   Lab Results  Component Value Date   INSULIN 32.7 (H) 01/06/2020   Lab Results  Component Value Date   TSH 2.850 01/06/2020   Lab Results  Component Value Date   CHOL 272 (H) 01/06/2020   HDL 49 01/06/2020   LDLCALC 179 (H) 01/06/2020   TRIG 235 (H) 01/06/2020   Lab Results  Component Value Date   WBC 6.7 04/12/2018   HGB 12.8 04/12/2018   HCT 40.3 04/12/2018   MCV 98.5 04/12/2018   PLT 165 04/12/2018   No results found for: IRON, TIBC, FERRITIN  Attestation Statements:   Reviewed by clinician on day of visit: allergies, medications, problem list, medical history, surgical history, family history, social history, and previous encounter notes.  06/12/2018, am acting as Fernanda Drum for Energy manager, DO   I have reviewed the above documentation for accuracy and completeness, and I agree with the above. [employer], DO

## 2020-04-04 ENCOUNTER — Other Ambulatory Visit (INDEPENDENT_AMBULATORY_CARE_PROVIDER_SITE_OTHER): Payer: Self-pay | Admitting: Bariatrics

## 2020-04-04 DIAGNOSIS — E559 Vitamin D deficiency, unspecified: Secondary | ICD-10-CM

## 2020-04-07 DIAGNOSIS — G4733 Obstructive sleep apnea (adult) (pediatric): Secondary | ICD-10-CM | POA: Diagnosis not present

## 2020-04-26 ENCOUNTER — Other Ambulatory Visit (INDEPENDENT_AMBULATORY_CARE_PROVIDER_SITE_OTHER): Payer: Self-pay | Admitting: Bariatrics

## 2020-04-26 DIAGNOSIS — E559 Vitamin D deficiency, unspecified: Secondary | ICD-10-CM

## 2020-04-28 ENCOUNTER — Ambulatory Visit (INDEPENDENT_AMBULATORY_CARE_PROVIDER_SITE_OTHER): Payer: BC Managed Care – PPO | Admitting: Bariatrics

## 2020-04-28 ENCOUNTER — Encounter (INDEPENDENT_AMBULATORY_CARE_PROVIDER_SITE_OTHER): Payer: Self-pay | Admitting: Bariatrics

## 2020-04-28 ENCOUNTER — Other Ambulatory Visit: Payer: Self-pay

## 2020-04-28 VITALS — BP 117/82 | HR 80 | Temp 98.1°F | Ht 63.0 in | Wt 197.0 lb

## 2020-04-28 DIAGNOSIS — Z9189 Other specified personal risk factors, not elsewhere classified: Secondary | ICD-10-CM | POA: Diagnosis not present

## 2020-04-28 DIAGNOSIS — E669 Obesity, unspecified: Secondary | ICD-10-CM | POA: Diagnosis not present

## 2020-04-28 DIAGNOSIS — E282 Polycystic ovarian syndrome: Secondary | ICD-10-CM

## 2020-04-28 DIAGNOSIS — E559 Vitamin D deficiency, unspecified: Secondary | ICD-10-CM | POA: Diagnosis not present

## 2020-04-28 DIAGNOSIS — Z6834 Body mass index (BMI) 34.0-34.9, adult: Secondary | ICD-10-CM

## 2020-04-28 MED ORDER — VITAMIN D (ERGOCALCIFEROL) 1.25 MG (50000 UNIT) PO CAPS: 50000.0000 [IU] | ORAL_CAPSULE | ORAL | 0 refills | Status: DC

## 2020-04-29 ENCOUNTER — Encounter (INDEPENDENT_AMBULATORY_CARE_PROVIDER_SITE_OTHER): Payer: Self-pay | Admitting: Bariatrics

## 2020-04-29 NOTE — Progress Notes (Signed)
Chief Complaint:   OBESITY Gina Norman is here to discuss her progress with her obesity treatment plan along with follow-up of her obesity related diagnoses. Gina Norman is on the Category 2 Plan and states she is following her eating plan approximately 95% of the time. Gina Norman states she is walking 7,000 steps 6-7 times per week.  Today's visit was #: 7 Starting weight: 209 lbs Starting date: 01/06/2020 Today's weight: 197 lbs Today's date: 04/28/2020 Total lbs lost to date: 12 Total lbs lost since last in-office visit: 3  Interim History: Gina Norman is down 3 lbs and has done well overall.  Subjective:   PCOS (polycystic ovarian syndrome). [name] denies polyphagia.  Vitamin D deficiency. No nausea, vomiting, or muscle weakness.    Ref. Range 01/06/2020 14:40  Vitamin D, 25-Hydroxy Latest Ref Range: 30.0 - 100.0 ng/mL 27.3 (L)   At risk of diabetes mellitus. Gina Norman is at higher than average risk for developing diabetes due to PCOS.  Assessment/Plan:   PCOS (polycystic ovarian syndrome). [name] will decrease carbohydrates, increase healthy fats and protein, and increase activity.  Vitamin D deficiency. Low Vitamin D level contributes to fatigue and are associated with obesity, breast, and colon cancer. She was given a prescription for Vitamin D, Ergocalciferol, (DRISDOL) 1.25 MG (50000 UNIT) CAPS capsule every week #4 with 0 refills and will follow-up for routine testing of Vitamin D, at least 2-3 times per year to avoid over-replacement.   At risk of diabetes mellitus. [name] was given approximately 15 minutes of diabetes education and counseling today. We discussed intensive lifestyle modifications today with an emphasis on weight loss as well as increasing exercise and decreasing simple carbohydrates in her diet. We also reviewed medication options with an emphasis on risk versus benefit of those discussed.   Repetitive spaced learning was employed today to elicit superior memory  formation and behavioral change.  Class 1 obesity with serious comorbidity and body mass index (BMI) of 34.0 to 34.9 in adult, unspecified obesity type.  [name] is currently in the action stage of change. As such, her goal is to continue with weight loss efforts. She has agreed to the Category 2 Plan.   She will work on meal planning and mindful eating.   Exercise goals: Gina Norman will increase her exercise.  Behavioral modification strategies: increasing lean protein intake, decreasing simple carbohydrates, increasing vegetables, increasing water intake, decreasing eating out, no skipping meals, meal planning and cooking strategies, keeping healthy foods in the home and planning for success.  Gina Norman has agreed to follow-up with our clinic fasting in 4 weeks. She was informed of the importance of frequent follow-up visits to maximize her success with intensive lifestyle modifications for her multiple health conditions.   Objective:   Blood pressure 117/82, pulse 80, temperature 98.1 F (36.7 C), height 5\' 3"  (1.6 m), weight 197 lb (89.4 kg), last menstrual period 09/03/2014, SpO2 99 %. Body mass index is 34.9 kg/m.  General: Cooperative, alert, well developed, in no acute distress. HEENT: Conjunctivae and lids unremarkable. Cardiovascular: Regular rhythm.  Lungs: Normal work of breathing. Neurologic: No focal deficits.   Lab Results  Component Value Date   CREATININE 0.73 02/25/2020   BUN 17 02/25/2020   NA 139 02/25/2020   K 3.9 02/25/2020   CL 102 02/25/2020   CO2 24 02/25/2020   Lab Results  Component Value Date   ALT 45 (H) 02/25/2020   AST 30 02/25/2020   ALKPHOS 77 02/25/2020   BILITOT 0.5 02/25/2020  Lab Results  Component Value Date   HGBA1C 5.8 (H) 01/06/2020   Lab Results  Component Value Date   INSULIN 32.7 (H) 01/06/2020   Lab Results  Component Value Date   TSH 2.850 01/06/2020   Lab Results  Component Value Date   CHOL 272 (H) 01/06/2020   HDL 49  01/06/2020   LDLCALC 179 (H) 01/06/2020   TRIG 235 (H) 01/06/2020   Lab Results  Component Value Date   WBC 6.7 04/12/2018   HGB 12.8 04/12/2018   HCT 40.3 04/12/2018   MCV 98.5 04/12/2018   PLT 165 04/12/2018   No results found for: IRON, TIBC, FERRITIN  Attestation Statements:   Reviewed by clinician on day of visit: allergies, medications, problem list, medical history, surgical history, family history, social history, and previous encounter notes.  Fernanda Drum, am acting as Energy manager for [employer], DO   I have reviewed the above documentation for accuracy and completeness, and I agree with the above. Corinna Capra, DO

## 2020-05-07 DIAGNOSIS — G4733 Obstructive sleep apnea (adult) (pediatric): Secondary | ICD-10-CM | POA: Diagnosis not present

## 2020-05-08 DIAGNOSIS — Z23 Encounter for immunization: Secondary | ICD-10-CM | POA: Diagnosis not present

## 2020-05-18 ENCOUNTER — Encounter (INDEPENDENT_AMBULATORY_CARE_PROVIDER_SITE_OTHER): Payer: Self-pay | Admitting: Bariatrics

## 2020-05-18 ENCOUNTER — Encounter: Payer: Self-pay | Admitting: Internal Medicine

## 2020-05-18 NOTE — Telephone Encounter (Signed)
Dr Brown pt °

## 2020-05-19 MED ORDER — METFORMIN HCL ER 500 MG PO TB24
500.0000 mg | ORAL_TABLET | Freq: Three times a day (TID) | ORAL | 3 refills | Status: DC
Start: 2020-05-19 — End: 2021-05-26

## 2020-05-26 ENCOUNTER — Ambulatory Visit (INDEPENDENT_AMBULATORY_CARE_PROVIDER_SITE_OTHER): Payer: BC Managed Care – PPO | Admitting: Bariatrics

## 2020-10-14 DIAGNOSIS — F900 Attention-deficit hyperactivity disorder, predominantly inattentive type: Secondary | ICD-10-CM | POA: Diagnosis not present

## 2020-10-14 DIAGNOSIS — Z23 Encounter for immunization: Secondary | ICD-10-CM | POA: Diagnosis not present

## 2020-10-14 DIAGNOSIS — I10 Essential (primary) hypertension: Secondary | ICD-10-CM | POA: Diagnosis not present

## 2020-10-14 DIAGNOSIS — E782 Mixed hyperlipidemia: Secondary | ICD-10-CM | POA: Diagnosis not present

## 2020-10-14 LAB — BASIC METABOLIC PANEL
BUN: 16 (ref 4–21)
CO2: 28 — AB (ref 13–22)
Chloride: 102 (ref 99–108)
Creatinine: 0.8 (ref 0.5–1.1)
Glucose: 107
Potassium: 4.4 (ref 3.4–5.3)
Sodium: 140 (ref 137–147)

## 2020-10-14 LAB — HEPATIC FUNCTION PANEL
ALT: 49 — AB (ref 7–35)
AST: 42 — AB (ref 13–35)

## 2020-10-14 LAB — COMPREHENSIVE METABOLIC PANEL: Calcium: 9.8 (ref 8.7–10.7)

## 2020-10-14 LAB — LIPID PANEL
Cholesterol: 151 (ref 0–200)
HDL: 40 (ref 35–70)
LDL Cholesterol: 86
Triglycerides: 144 (ref 40–160)

## 2020-10-21 DIAGNOSIS — M79644 Pain in right finger(s): Secondary | ICD-10-CM | POA: Diagnosis not present

## 2020-10-21 DIAGNOSIS — R2231 Localized swelling, mass and lump, right upper limb: Secondary | ICD-10-CM | POA: Diagnosis not present

## 2020-10-21 DIAGNOSIS — M71341 Other bursal cyst, right hand: Secondary | ICD-10-CM | POA: Diagnosis not present

## 2020-11-04 DIAGNOSIS — Z1231 Encounter for screening mammogram for malignant neoplasm of breast: Secondary | ICD-10-CM | POA: Diagnosis not present

## 2020-11-04 DIAGNOSIS — Z01419 Encounter for gynecological examination (general) (routine) without abnormal findings: Secondary | ICD-10-CM | POA: Diagnosis not present

## 2020-11-04 DIAGNOSIS — Z1509 Genetic susceptibility to other malignant neoplasm: Secondary | ICD-10-CM | POA: Diagnosis not present

## 2020-12-01 ENCOUNTER — Ambulatory Visit (INDEPENDENT_AMBULATORY_CARE_PROVIDER_SITE_OTHER): Payer: BC Managed Care – PPO | Admitting: Bariatrics

## 2020-12-15 ENCOUNTER — Ambulatory Visit (INDEPENDENT_AMBULATORY_CARE_PROVIDER_SITE_OTHER): Payer: BC Managed Care – PPO | Admitting: Bariatrics

## 2021-01-06 DIAGNOSIS — H52203 Unspecified astigmatism, bilateral: Secondary | ICD-10-CM | POA: Diagnosis not present

## 2021-01-06 DIAGNOSIS — H5213 Myopia, bilateral: Secondary | ICD-10-CM | POA: Diagnosis not present

## 2021-01-12 ENCOUNTER — Other Ambulatory Visit: Payer: Self-pay

## 2021-01-12 ENCOUNTER — Ambulatory Visit (INDEPENDENT_AMBULATORY_CARE_PROVIDER_SITE_OTHER): Payer: BC Managed Care – PPO | Admitting: Bariatrics

## 2021-01-12 ENCOUNTER — Encounter (INDEPENDENT_AMBULATORY_CARE_PROVIDER_SITE_OTHER): Payer: Self-pay | Admitting: Bariatrics

## 2021-01-12 VITALS — BP 120/82 | HR 87 | Temp 98.5°F | Ht 63.0 in | Wt 213.0 lb

## 2021-01-12 DIAGNOSIS — Z9189 Other specified personal risk factors, not elsewhere classified: Secondary | ICD-10-CM

## 2021-01-12 DIAGNOSIS — Z6837 Body mass index (BMI) 37.0-37.9, adult: Secondary | ICD-10-CM

## 2021-01-12 DIAGNOSIS — Z0289 Encounter for other administrative examinations: Secondary | ICD-10-CM

## 2021-01-12 DIAGNOSIS — R748 Abnormal levels of other serum enzymes: Secondary | ICD-10-CM

## 2021-01-12 DIAGNOSIS — R7303 Prediabetes: Secondary | ICD-10-CM | POA: Diagnosis not present

## 2021-01-12 DIAGNOSIS — R0602 Shortness of breath: Secondary | ICD-10-CM

## 2021-01-12 DIAGNOSIS — R5383 Other fatigue: Secondary | ICD-10-CM | POA: Diagnosis not present

## 2021-01-12 DIAGNOSIS — Z1331 Encounter for screening for depression: Secondary | ICD-10-CM

## 2021-01-12 DIAGNOSIS — E559 Vitamin D deficiency, unspecified: Secondary | ICD-10-CM

## 2021-01-12 DIAGNOSIS — F5089 Other specified eating disorder: Secondary | ICD-10-CM

## 2021-01-12 DIAGNOSIS — E785 Hyperlipidemia, unspecified: Secondary | ICD-10-CM

## 2021-01-12 DIAGNOSIS — I10 Essential (primary) hypertension: Secondary | ICD-10-CM

## 2021-01-13 LAB — HEMOGLOBIN A1C
Est. average glucose Bld gHb Est-mCnc: 128 mg/dL
Hgb A1c MFr Bld: 6.1 % — ABNORMAL HIGH (ref 4.8–5.6)

## 2021-01-13 LAB — INSULIN, RANDOM: INSULIN: 61.1 u[IU]/mL — ABNORMAL HIGH (ref 2.6–24.9)

## 2021-01-13 LAB — VITAMIN D 25 HYDROXY (VIT D DEFICIENCY, FRACTURES): Vit D, 25-Hydroxy: 23.1 ng/mL — ABNORMAL LOW (ref 30.0–100.0)

## 2021-01-14 ENCOUNTER — Encounter (INDEPENDENT_AMBULATORY_CARE_PROVIDER_SITE_OTHER): Payer: Self-pay | Admitting: Bariatrics

## 2021-01-14 NOTE — Progress Notes (Signed)
Chief Complaint:   OBESITY Gina Norman (MR# [id number]) is a 55 y.o. female who presents for evaluation and treatment of obesity and related comorbidities. Current BMI is Body mass index is 37.73 kg/m. Gina Norman has been struggling with her weight for many years and has been unsuccessful in either losing weight, maintaining weight loss, or reaching her healthy weight goal.  Gina Norman is currently in the action stage of change and ready to dedicate time achieving and maintaining a healthier weight. Gina Norman is interested in becoming our patient and working on intensive lifestyle modifications including (but not limited to) diet and exercise for weight loss.  Gina Norman is a returning Gina Norman to our family and her last visit was 04/28/2020 with me. She is struggling with stress eating.  Gina Norman's habits were reviewed today and are as follows: Her family eats meals together, she thinks her family will eat healthier with her, she struggles with family and or coworkers weight loss sabotage, her desired weight loss is 63 lbs, she started gaining weight in 2006-2007, her heaviest weight ever was 218 pounds, she has significant food cravings issues, she snacks frequently in the evenings, she skips meals frequently, she is frequently drinking liquids with calories, she frequently makes poor food choices, she frequently eats larger portions than normal, she has binge eating behaviors, and she struggles with emotional eating.  Depression Screen Gina Norman's Food and Mood (modified PHQ-9) score was 11.  Depression screen PHQ 2/9 01/12/2021  Decreased Interest 1  Down, Depressed, Hopeless 1  PHQ - 2 Score 2  Altered sleeping 1  Tired, decreased energy 2  Change in appetite 3  Feeling bad or failure about yourself  1  Trouble concentrating 1  Moving slowly or fidgety/restless 1  Suicidal thoughts 0  PHQ-9 Score 11  Difficult doing work/chores Very difficult   Subjective:   1. Other fatigue Gina Norman admits to  daytime somnolence and admits to waking up still tired. Patent has a history of symptoms of daytime fatigue and morning fatigue. Gina Norman generally gets  6-9  hours of sleep per night, and states that she has poor sleep quality. Snoring is present. Apneic episodes are present. Epworth Sleepiness Score is 3.  2. SOB (shortness of breath) on exertion Gina Norman notes increasing shortness of breath with exercising and seems to be worsening over time with weight gain. She notes getting out of breath sooner with activity than she used to. This has gotten worse recently. Gina Norman denies shortness of breath at rest or orthopnea.  3. Essential hypertension Gina Norman's BP is reasonably well controlled.   4. Pre-diabetes Gina Norman has a history of PCOS. She is taking Metformin.  5. Vitamin D insufficiency Gina Norman is taking prescription Vit D.  6. Elevated liver enzymes Gina Norman denies abdominal pain. Last labs 10/2020- AST= 42 and ALT normal.  7. Hyperlipidemia LDL goal <100 Gina Norman is taking Zetia.  8. Other disorder of eating Gina Norman reports stress.  9. At risk for activity intolerance Gina Norman is at risk for exercise intolerance due to obesity and weather.  Assessment/Plan:   1. Other fatigue Gina Norman does feel that her weight is causing her energy to be lower than it should be. Fatigue may be related to obesity, depression or many other causes. Labs will be ordered, and in the meanwhile, Gina Norman will focus on self care including making healthy food choices, increasing physical activity and focusing on stress reduction. Check labs today.  - EKG 12-Lead - Hemoglobin A1c - Insulin, random - VITAMIN  D 25 Hydroxy (Vit-D Deficiency, Fractures)  2. SOB (shortness of breath) on exertion Gina Norman does feel that she gets out of breath more easily that she used to when she exercises. Gina Norman's shortness of breath appears to be obesity related and exercise induced. She has agreed to work on weight loss and gradually  increase exercise to treat her exercise induced shortness of breath. Will continue to monitor closely.  3. Essential hypertension Gina Norman is working on healthy weight loss and exercise to improve blood pressure control. We will watch for signs of hypotension as she continues her lifestyle modifications. Continue current treatment plan.  4. Pre-diabetes Gina Norman will continue to work on weight loss, exercise, and decreasing simple carbohydrates to help decrease the risk of diabetes. Continue Metformin. Check labs today.  - Hemoglobin A1c - Insulin, random  5. Vitamin D insufficiency Low Vitamin D level contributes to fatigue and are associated with obesity, breast, and colon cancer. She agrees to continue to take prescription Vitamin D @50 ,000 IU every week and will follow-up for routine testing of Vitamin D, at least 2-3 times per year to avoid over-replacement. Check labs today.  - VITAMIN D 25 Hydroxy (Vit-D Deficiency, Fractures)  6. Elevated liver enzymes Will follow over time.   7. Hyperlipidemia LDL goal <100 Cardiovascular risk and specific lipid/LDL goals reviewed.  We discussed several lifestyle modifications today and Gina Norman will continue to work on diet, exercise and weight loss efforts. Orders and follow up as documented in patient record.   Counseling Intensive lifestyle modifications are the first line treatment for this issue. Dietary changes: Increase soluble fiber. Decrease simple carbohydrates. Exercise changes: Moderate to vigorous-intensity aerobic activity 150 minutes per week if tolerated. Lipid-lowering medications: see documented in medical record.  8. Other disorder of eating Behavior modification techniques were discussed today to help Gina Norman deal with her emotional/non-hunger eating behaviors.  Orders and follow up as documented in patient record. Gina Norman will continue with her therapist.  9. Depression screen Gina Norman had a positive depression screening. Depression  is commonly associated with obesity and often results in emotional eating behaviors. We will monitor this closely and work on CBT to help improve the non-hunger eating patterns. Referral to Psychology may be required if no improvement is seen as she continues in our clinic.  10. At risk for activity intolerance Gina Norman was given approximately 15 minutes of exercise intolerance counseling today. She is 55 y.o. female and has risk factors exercise intolerance including obesity. We discussed intensive lifestyle modifications today with an emphasis on specific weight loss instructions and strategies. Gina Norman will slowly increase activity as tolerated.  Repetitive spaced learning was employed today to elicit superior memory formation and behavioral change.   11. Class 2 severe obesity with serious comorbidity and body mass index (BMI) of 37.0 to 37.9 in adult, unspecified obesity type (HCC)  Gina Norman is currently in the action stage of change and her goal is to continue with weight loss efforts. I recommend Gina Norman begin the structured treatment plan as follows:  She has agreed to the Category 1 Plan.  Meal planning  Intentional eating 10/14/2020 labs reviewed  Exercise goals:  As is    Behavioral modification strategies: increasing lean protein intake, decreasing simple carbohydrates, increasing vegetables, increasing water intake, decreasing liquid calories, decreasing eating out, no skipping meals, meal planning and cooking strategies, keeping healthy foods in the home, and planning for success.  She was informed of the importance of frequent follow-up visits to maximize her success with intensive lifestyle  modifications for her multiple health conditions. She was informed we would discuss her lab results at her next visit unless there is a critical issue that needs to be addressed sooner. Elajah agreed to keep her next visit at the agreed upon time to discuss these results.  Objective:   Blood  pressure 120/82, pulse 87, temperature 98.5 F (36.9 C), height 5\' 3"  (1.6 m), weight 213 lb (96.6 kg), last menstrual period 09/03/2014, SpO2 98 %. Body mass index is 37.73 kg/m.  EKG: Normal sinus rhythm, rate 88.  Indirect Calorimeter completed today shows a VO2 of 199 and a REE of 1388.  Her calculated basal metabolic rate is 09/05/2014 thus her basal metabolic rate is worse than expected.  General: Cooperative, alert, well developed, in no acute distress. HEENT: Conjunctivae and lids unremarkable. Cardiovascular: Regular rhythm.  Lungs: Normal work of breathing. Neurologic: No focal deficits.   Lab Results  Component Value Date   CREATININE 0.73 02/25/2020   BUN 17 02/25/2020   NA 139 02/25/2020   K 3.9 02/25/2020   CL 102 02/25/2020   CO2 24 02/25/2020   Lab Results  Component Value Date   ALT 45 (H) 02/25/2020   AST 30 02/25/2020   ALKPHOS 77 02/25/2020   BILITOT 0.5 02/25/2020   Lab Results  Component Value Date   HGBA1C 6.1 (H) 01/12/2021   HGBA1C 5.8 (H) 01/06/2020   Lab Results  Component Value Date   INSULIN 61.1 (H) 01/12/2021   INSULIN 32.7 (H) 01/06/2020   Lab Results  Component Value Date   TSH 2.850 01/06/2020   Lab Results  Component Value Date   CHOL 272 (H) 01/06/2020   HDL 49 01/06/2020   LDLCALC 179 (H) 01/06/2020   TRIG 235 (H) 01/06/2020   Lab Results  Component Value Date   WBC 6.7 04/12/2018   HGB 12.8 04/12/2018   HCT 40.3 04/12/2018   MCV 98.5 04/12/2018   PLT 165 04/12/2018    Attestation Statements:   Reviewed by clinician on day of visit: allergies, medications, problem list, medical history, surgical history, family history, social history, and previous encounter notes.  06/12/2018, CMA, am acting as Edmund Hilda for Energy manager, DO.  I have reviewed the above documentation for accuracy and completeness, and I agree with the above. [employer], DO

## 2021-01-20 ENCOUNTER — Other Ambulatory Visit (INDEPENDENT_AMBULATORY_CARE_PROVIDER_SITE_OTHER): Payer: Self-pay

## 2021-01-26 ENCOUNTER — Other Ambulatory Visit: Payer: Self-pay

## 2021-01-26 ENCOUNTER — Encounter (INDEPENDENT_AMBULATORY_CARE_PROVIDER_SITE_OTHER): Payer: Self-pay | Admitting: Bariatrics

## 2021-01-26 ENCOUNTER — Ambulatory Visit (INDEPENDENT_AMBULATORY_CARE_PROVIDER_SITE_OTHER): Payer: BC Managed Care – PPO | Admitting: Bariatrics

## 2021-01-26 VITALS — BP 124/86 | HR 99 | Temp 98.2°F | Ht 63.0 in | Wt 209.0 lb

## 2021-01-26 DIAGNOSIS — R7303 Prediabetes: Secondary | ICD-10-CM | POA: Diagnosis not present

## 2021-01-26 DIAGNOSIS — E559 Vitamin D deficiency, unspecified: Secondary | ICD-10-CM | POA: Diagnosis not present

## 2021-01-26 DIAGNOSIS — Z9189 Other specified personal risk factors, not elsewhere classified: Secondary | ICD-10-CM | POA: Diagnosis not present

## 2021-01-26 DIAGNOSIS — Z6837 Body mass index (BMI) 37.0-37.9, adult: Secondary | ICD-10-CM

## 2021-01-27 DIAGNOSIS — Z23 Encounter for immunization: Secondary | ICD-10-CM | POA: Diagnosis not present

## 2021-02-01 ENCOUNTER — Encounter (INDEPENDENT_AMBULATORY_CARE_PROVIDER_SITE_OTHER): Payer: Self-pay | Admitting: Bariatrics

## 2021-02-01 NOTE — Progress Notes (Signed)
Chief Complaint:   OBESITY Gina Norman is here to discuss her progress with her obesity treatment plan along with follow-up of her obesity related diagnoses. Gina Norman is on the Category 1 Plan and states she is following her eating plan approximately 95% of the time. [name] states she is not currently exercising.  Today's visit was #: 2 Starting weight: 213 lbs Starting date: 01/12/2021 Today's weight: 209 lbs Today's date: 01/26/2021 Total lbs lost to date: 4 Total lbs lost since last in-office visit: 4  Interim History: Gina Norman is down 4 lbs since her first visit after reestablishing care. She did not struggle with the plan.  Subjective:   1. Vitamin D insufficiency She is currently taking prescription vitamin D3 50,000 IU each week. She has a Vit D level of 23.1.  Lab Results  Component Value Date   VD25OH 23.1 (L) 01/12/2021   VD25OH 27.3 (L) 01/06/2020   2. Pre-diabetes Gina Norman is taking Metformin. She reports a normal appetite. Her A1c is 6.1 and insulin level 61.1.  Lab Results  Component Value Date   HGBA1C 6.1 (H) 01/12/2021   Lab Results  Component Value Date   INSULIN 61.1 (H) 01/12/2021   INSULIN 32.7 (H) 01/06/2020   3. At risk for diabetes mellitus Gina Norman is at higher than average risk for developing diabetes due to obesity.   Assessment/Plan:   1. Vitamin D insufficiency Low Vitamin D level contributes to fatigue and are associated with obesity, breast, and colon cancer. She agrees to continue to take prescription Vitamin D3 50,000 IU every week and will follow-up for routine testing of Vitamin D, at least 2-3 times per year to avoid over-replacement.  2. Pre-diabetes Gina Norman will continue to work on weight loss, exercise, and decreasing simple carbohydrates to help decrease the risk of diabetes. Continue current treatment plan. Handouts for pre-diabetes and insulin resistance provided.  3. At risk for diabetes mellitus Gina Norman was given approximately 15  minutes of diabetes education and counseling today. We discussed intensive lifestyle modifications today with an emphasis on weight loss as well as increasing exercise and decreasing simple carbohydrates in her diet. We also reviewed medication options with an emphasis on risk versus benefit of those discussed.   Repetitive spaced learning was employed today to elicit superior memory formation and behavioral change.  4. Obesity, current BMI 37  Gina Norman is currently in the action stage of change. As such, her goal is to continue with weight loss efforts. She has agreed to the Category 1 Plan.   Meal plan Intentional eating 01/12/21 labs reviewed with pt.  Exercise goals:  As is  Behavioral modification strategies: increasing lean protein intake, decreasing simple carbohydrates, increasing vegetables, increasing water intake, no skipping meals, ways to avoid boredom eating, ways to avoid night time snacking, better snacking choices, emotional eating strategies, and planning for success.  Gina Norman has agreed to follow-up with our clinic in 2 weeks. She was informed of the importance of frequent follow-up visits to maximize her success with intensive lifestyle modifications for her multiple health conditions.   Objective:   Blood pressure 124/86, pulse 99, temperature 98.2 F (36.8 C), height 5\' 3"  (1.6 m), weight 209 lb (94.8 kg), last menstrual period 09/03/2014, SpO2 96 %. Body mass index is 37.02 kg/m.  General: Cooperative, alert, well developed, in no acute distress. HEENT: Conjunctivae and lids unremarkable. Cardiovascular: Regular rhythm.  Lungs: Normal work of breathing. Neurologic: No focal deficits.   Lab Results  Component Value Date  CREATININE 0.8 10/14/2020   BUN 16 10/14/2020   NA 140 10/14/2020   K 4.4 10/14/2020   CL 102 10/14/2020   CO2 28 (A) 10/14/2020   Lab Results  Component Value Date   ALT 49 (A) 10/14/2020   AST 42 (A) 10/14/2020   ALKPHOS 77 02/25/2020    BILITOT 0.5 02/25/2020   Lab Results  Component Value Date   HGBA1C 6.1 (H) 01/12/2021   HGBA1C 5.8 (H) 01/06/2020   Lab Results  Component Value Date   INSULIN 61.1 (H) 01/12/2021   INSULIN 32.7 (H) 01/06/2020   Lab Results  Component Value Date   TSH 2.850 01/06/2020   Lab Results  Component Value Date   CHOL 151 10/14/2020   HDL 40 10/14/2020   LDLCALC 86 10/14/2020   TRIG 144 10/14/2020   Lab Results  Component Value Date   VD25OH 23.1 (L) 01/12/2021   VD25OH 27.3 (L) 01/06/2020   Lab Results  Component Value Date   WBC 6.7 04/12/2018   HGB 12.8 04/12/2018   HCT 40.3 04/12/2018   MCV 98.5 04/12/2018   PLT 165 04/12/2018   No results found for: IRON, TIBC, FERRITIN  Attestation Statements:   Reviewed by clinician on day of visit: allergies, medications, problem list, medical history, surgical history, family history, social history, and previous encounter notes.  Edmund Hilda, CMA, am acting as Energy manager for [employer], DO.  I have reviewed the above documentation for accuracy and completeness, and I agree with the above. Corinna Capra, DO

## 2021-02-09 ENCOUNTER — Encounter (INDEPENDENT_AMBULATORY_CARE_PROVIDER_SITE_OTHER): Payer: Self-pay | Admitting: Bariatrics

## 2021-02-09 ENCOUNTER — Ambulatory Visit (INDEPENDENT_AMBULATORY_CARE_PROVIDER_SITE_OTHER): Payer: BC Managed Care – PPO | Admitting: Bariatrics

## 2021-02-09 ENCOUNTER — Other Ambulatory Visit: Payer: Self-pay

## 2021-02-09 VITALS — BP 128/86 | HR 91 | Temp 98.4°F | Ht 63.0 in | Wt 209.0 lb

## 2021-02-09 DIAGNOSIS — Z6837 Body mass index (BMI) 37.0-37.9, adult: Secondary | ICD-10-CM

## 2021-02-09 DIAGNOSIS — R7303 Prediabetes: Secondary | ICD-10-CM | POA: Diagnosis not present

## 2021-02-09 DIAGNOSIS — I10 Essential (primary) hypertension: Secondary | ICD-10-CM | POA: Diagnosis not present

## 2021-02-10 NOTE — Progress Notes (Signed)
Chief Complaint:   OBESITY Novia is here to discuss her progress with her obesity treatment plan along with follow-up of her obesity related diagnoses. Kyna is on the Category 1 Plan and states she is following her eating plan approximately 80% of the time. Kelis states she is walking for 30 minutes 2-3 times per week.  Today's visit was #: 3 Starting weight: 213 lbs Starting date: 01/12/2021 Today's weight: 209 lbs Today's date: 02/09/2021 Total lbs lost to date: 4 lbs Total lbs lost since last in-office visit: 0  Interim History: Jillana's weight remains the same as her previous visit. She has been going out to dinner more.  Subjective:   1. Prediabetes Kinisha is taking Metformin XR.  2. Essential hypertension Westlyn is taking Zestoretic. Her hypertension is reasonably controlled.  Assessment/Plan:   1. Prediabetes Giavana will continue he medication. She will continue to work on weight loss, exercise, and decreasing simple carbohydrates to help decrease the risk of diabetes.    2. Essential hypertension Salayah will continue her medication. She is working on healthy weight loss and exercise to improve blood pressure control. We will watch for signs of hypotension as she continues her lifestyle modifications.   3. Obesity, current BMI 37.2 Latisia is currently in the action stage of change. As such, her goal is to continue with weight loss efforts. She has agreed to the Category 1 Plan.   Miel will continue meal planning. She will continue intentional eating. She will increase H2O. She will decrease eating out. Handout sheet for protein shakes were given out.  Exercise goals:  As is.  Behavioral modification strategies: increasing lean protein intake, decreasing simple carbohydrates, increasing vegetables, increasing water intake, decreasing eating out, no skipping meals, meal planning and cooking strategies, keeping healthy foods in the home, and planning for  success.  Kaija has agreed to follow-up with our clinic in 2 weeks. She was informed of the importance of frequent follow-up visits to maximize her success with intensive lifestyle modifications for her multiple health conditions.   Objective:   Blood pressure 128/86, pulse 91, temperature 98.4 F (36.9 C), height 5\' 3"  (1.6 m), weight 209 lb (94.8 kg), last menstrual period 09/03/2014, SpO2 96 %. Body mass index is 37.02 kg/m.  General: Cooperative, alert, well developed, in no acute distress. HEENT: Conjunctivae and lids unremarkable. Cardiovascular: Regular rhythm.  Lungs: Normal work of breathing. Neurologic: No focal deficits.   Lab Results  Component Value Date   CREATININE 0.8 10/14/2020   BUN 16 10/14/2020   NA 140 10/14/2020   K 4.4 10/14/2020   CL 102 10/14/2020   CO2 28 (A) 10/14/2020   Lab Results  Component Value Date   ALT 49 (A) 10/14/2020   AST 42 (A) 10/14/2020   ALKPHOS 77 02/25/2020   BILITOT 0.5 02/25/2020   Lab Results  Component Value Date   HGBA1C 6.1 (H) 01/12/2021   HGBA1C 5.8 (H) 01/06/2020   Lab Results  Component Value Date   INSULIN 61.1 (H) 01/12/2021   INSULIN 32.7 (H) 01/06/2020   Lab Results  Component Value Date   TSH 2.850 01/06/2020   Lab Results  Component Value Date   CHOL 151 10/14/2020   HDL 40 10/14/2020   LDLCALC 86 10/14/2020   TRIG 144 10/14/2020   Lab Results  Component Value Date   VD25OH 23.1 (L) 01/12/2021   VD25OH 27.3 (L) 01/06/2020   Lab Results  Component Value Date   WBC 6.7 04/12/2018  HGB 12.8 04/12/2018   HCT 40.3 04/12/2018   MCV 98.5 04/12/2018   PLT 165 04/12/2018   No results found for: IRON, TIBC, FERRITIN  Attestation Statements:   Reviewed by clinician on day of visit: allergies, medications, problem list, medical history, surgical history, family history, social history, and previous encounter notes.  Time spent on visit including pre-visit chart review and post-visit care and  charting was 20 minutes.   I, Jackson Latino, RMA, am acting as Energy manager for [employer], DO.   I have reviewed the above documentation for accuracy and completeness, and I agree with the above. Corinna Capra, DO

## 2021-02-11 ENCOUNTER — Encounter (INDEPENDENT_AMBULATORY_CARE_PROVIDER_SITE_OTHER): Payer: Self-pay | Admitting: Bariatrics

## 2021-02-22 ENCOUNTER — Ambulatory Visit (INDEPENDENT_AMBULATORY_CARE_PROVIDER_SITE_OTHER): Payer: BC Managed Care – PPO | Admitting: Bariatrics

## 2021-02-22 ENCOUNTER — Other Ambulatory Visit: Payer: Self-pay

## 2021-02-22 ENCOUNTER — Encounter (INDEPENDENT_AMBULATORY_CARE_PROVIDER_SITE_OTHER): Payer: Self-pay | Admitting: Bariatrics

## 2021-02-22 VITALS — BP 118/79 | HR 85 | Temp 98.0°F | Ht 63.0 in | Wt 210.0 lb

## 2021-02-22 DIAGNOSIS — R7303 Prediabetes: Secondary | ICD-10-CM | POA: Diagnosis not present

## 2021-02-22 DIAGNOSIS — Z6837 Body mass index (BMI) 37.0-37.9, adult: Secondary | ICD-10-CM

## 2021-02-22 DIAGNOSIS — I10 Essential (primary) hypertension: Secondary | ICD-10-CM

## 2021-02-22 DIAGNOSIS — Z9189 Other specified personal risk factors, not elsewhere classified: Secondary | ICD-10-CM

## 2021-02-22 MED ORDER — TIRZEPATIDE 2.5 MG/0.5ML ~~LOC~~ SOAJ: 2.5000 mg | SUBCUTANEOUS | 0 refills | Status: DC

## 2021-02-24 NOTE — Progress Notes (Signed)
Chief Complaint:   OBESITY Misti is here to discuss her progress with her obesity treatment plan along with follow-up of her obesity related diagnoses. Vallie is on the Category 1 Plan and states she is following her eating plan approximately 80% of the time. Deshawna states she is doing 0 minutes 0 times per week.  Today's visit was #: 4 Starting weight: 213 lbs Starting date: 01/12/2021 Today's weight: 210 lbs Today's date: 02/22/2021 Total lbs lost to date: 3 lbs Total lbs lost since last in-office visit: 0  Interim History: Jonice is up 1 lb since her last visit.  Subjective:   1. Prediabetes Syeda is currently taking Metformin. She notes hunger in the evenings.  2. Essential hypertension Shawntia is currently taking Zestoretic.  3. At risk for side effect of medication Annebelle is at risk for side effect of medication due to nausea, vomiting and constipation.  Assessment/Plan:   1. Prediabetes Dyllan will continue her medications. She will to work on weight loss, exercise, and decreasing simple carbohydrates to help decrease the risk of diabetes. We will refill Mounjaro for 1 month with no refills.  - tirzepatide [facility]) 2.5 MG/0.5ML Pen; Inject 2.5 mg into the skin once a week.  Dispense: 2 mL; Refill: 0  2. Essential hypertension Cathline will continue medications. She is working on healthy weight loss and exercise to improve blood pressure control. We will watch for signs of hypotension as she continues her lifestyle modifications.   3. At risk for side effect of medication Lucee was given approximately 15 minutes of drug side effect counseling today.  We discussed side effect possibility and risk versus benefits. Dolce agreed to the medication and will contact this office if these side effects are intolerable.  Repetitive spaced learning was employed today to elicit superior memory formation and behavioral change.   4. Obesity, current BMI 37.2 Agripina is  currently in the action stage of change. As such, her goal is to continue with weight loss efforts. She has agreed to the Category 1 Plan.   Beyonca will continue meal planning. She will continue intentional eating.  Exercise goals: No exercise has been prescribed at this time.  Behavioral modification strategies: increasing lean protein intake, decreasing simple carbohydrates, increasing vegetables, increasing water intake, decreasing eating out, no skipping meals, meal planning and cooking strategies, keeping healthy foods in the home, and planning for success.  Nollie has agreed to follow-up with our clinic in 2-3 weeks (fasting). She was informed of the importance of frequent follow-up visits to maximize her success with intensive lifestyle modifications for her multiple health conditions.   Objective:   Blood pressure 118/79, pulse 85, temperature 98 F (36.7 C), height 5\' 3"  (1.6 m), weight 210 lb (95.3 kg), last menstrual period 09/03/2014, SpO2 99 %. Body mass index is 37.2 kg/m.  General: Cooperative, alert, well developed, in no acute distress. HEENT: Conjunctivae and lids unremarkable. Cardiovascular: Regular rhythm.  Lungs: Normal work of breathing. Neurologic: No focal deficits.   Lab Results  Component Value Date   CREATININE 0.8 10/14/2020   BUN 16 10/14/2020   NA 140 10/14/2020   K 4.4 10/14/2020   CL 102 10/14/2020   CO2 28 (A) 10/14/2020   Lab Results  Component Value Date   ALT 49 (A) 10/14/2020   AST 42 (A) 10/14/2020   ALKPHOS 77 02/25/2020   BILITOT 0.5 02/25/2020   Lab Results  Component Value Date   HGBA1C 6.1 (H) 01/12/2021   HGBA1C  5.8 (H) 01/06/2020   Lab Results  Component Value Date   INSULIN 61.1 (H) 01/12/2021   INSULIN 32.7 (H) 01/06/2020   Lab Results  Component Value Date   TSH 2.850 01/06/2020   Lab Results  Component Value Date   CHOL 151 10/14/2020   HDL 40 10/14/2020   LDLCALC 86 10/14/2020   TRIG 144 10/14/2020   Lab  Results  Component Value Date   VD25OH 23.1 (L) 01/12/2021   VD25OH 27.3 (L) 01/06/2020   Lab Results  Component Value Date   WBC 6.7 04/12/2018   HGB 12.8 04/12/2018   HCT 40.3 04/12/2018   MCV 98.5 04/12/2018   PLT 165 04/12/2018   No results found for: IRON, TIBC, FERRITIN  Attestation Statements:   Reviewed by clinician on day of visit: allergies, medications, problem list, medical history, surgical history, family history, social history, and previous encounter notes.  Time spent on visit including pre-visit chart review and post-visit care and charting was 20 minutes.   I, Jackson Latino, RMA, am acting as Energy manager for [employer], DO.   I have reviewed the above documentation for accuracy and completeness, and I agree with the above. Corinna Capra, DO

## 2021-02-25 ENCOUNTER — Encounter (INDEPENDENT_AMBULATORY_CARE_PROVIDER_SITE_OTHER): Payer: Self-pay | Admitting: Bariatrics

## 2021-03-04 ENCOUNTER — Other Ambulatory Visit (HOSPITAL_BASED_OUTPATIENT_CLINIC_OR_DEPARTMENT_OTHER): Payer: Self-pay

## 2021-03-04 ENCOUNTER — Other Ambulatory Visit (INDEPENDENT_AMBULATORY_CARE_PROVIDER_SITE_OTHER): Payer: Self-pay

## 2021-03-04 ENCOUNTER — Encounter (INDEPENDENT_AMBULATORY_CARE_PROVIDER_SITE_OTHER): Payer: Self-pay | Admitting: Bariatrics

## 2021-03-04 DIAGNOSIS — R7303 Prediabetes: Secondary | ICD-10-CM

## 2021-03-04 MED ORDER — TIRZEPATIDE 2.5 MG/0.5ML ~~LOC~~ SOAJ
2.5000 mg | SUBCUTANEOUS | 0 refills | Status: DC
  Filled 2021-03-04: qty 2, 28d supply, fill #0

## 2021-03-23 ENCOUNTER — Other Ambulatory Visit (HOSPITAL_BASED_OUTPATIENT_CLINIC_OR_DEPARTMENT_OTHER): Payer: Self-pay

## 2021-03-23 ENCOUNTER — Ambulatory Visit (INDEPENDENT_AMBULATORY_CARE_PROVIDER_SITE_OTHER): Payer: BC Managed Care – PPO | Admitting: Bariatrics

## 2021-03-23 ENCOUNTER — Other Ambulatory Visit: Payer: Self-pay

## 2021-03-23 ENCOUNTER — Encounter (INDEPENDENT_AMBULATORY_CARE_PROVIDER_SITE_OTHER): Payer: Self-pay | Admitting: Bariatrics

## 2021-03-23 VITALS — BP 117/80 | HR 87 | Temp 98.1°F | Ht 63.0 in | Wt 205.0 lb

## 2021-03-23 DIAGNOSIS — I10 Essential (primary) hypertension: Secondary | ICD-10-CM

## 2021-03-23 DIAGNOSIS — E559 Vitamin D deficiency, unspecified: Secondary | ICD-10-CM

## 2021-03-23 DIAGNOSIS — R7303 Prediabetes: Secondary | ICD-10-CM

## 2021-03-23 DIAGNOSIS — Z6837 Body mass index (BMI) 37.0-37.9, adult: Secondary | ICD-10-CM

## 2021-03-23 DIAGNOSIS — Z9189 Other specified personal risk factors, not elsewhere classified: Secondary | ICD-10-CM

## 2021-03-23 MED ORDER — TIRZEPATIDE 2.5 MG/0.5ML ~~LOC~~ SOAJ
2.5000 mg | SUBCUTANEOUS | 0 refills | Status: DC
  Filled 2021-03-23: qty 2, 28d supply, fill #0

## 2021-03-23 MED ORDER — TIRZEPATIDE 2.5 MG/0.5ML ~~LOC~~ SOAJ
2.5000 mg | SUBCUTANEOUS | 0 refills | Status: DC
  Filled 2021-03-23 – 2021-03-26 (×2): qty 2, 28d supply, fill #0

## 2021-03-23 NOTE — Progress Notes (Signed)
Chief Complaint:   OBESITY Gina Norman is here to discuss her progress with her obesity treatment plan along with follow-up of her obesity related diagnoses. Gina Norman is on the Category 1 Plan and states she is following her eating plan approximately 95% of the time. Gina Norman states she is walking for 15 minutes 2-3 times per week.  Today's visit was #: 5 Starting weight: 213 lbs Starting date: 01/12/2021 Today's weight: 205 lbs Today's date: 03/23/2021 Total lbs lost to date: 8 l;bs Total lbs lost since last in-office visit: 5 lbs  Interim History: Gina Norman is down 5 lbs since her last visit.  Subjective:   1. Prediabetes Gina Norman will get back to walking. She notes indigestion.  2. Essential hypertension Rhythm's hypertension is controlled.  3. Vitamin D deficiency Gina Norman is currently taking Vitamin D.  4. At risk for diabetes mellitus Gina Norman is at risk for diabetes mellitus due to pre-diabetes.  Assessment/Plan:   1. Prediabetes Gina Norman will continue to work on weight loss, exercise, and decreasing simple carbohydrates to help decrease the risk of diabetes. We will refill Mounjaro 2.5 mg weekly for 1 month with no refills.  - tirzepatide [facility]) 2.5 MG/0.5ML Pen; Inject 2.5 mg into the skin once a week.  Dispense: 2 mL; Refill: 0 - Comprehensive metabolic panel - Insulin, random  2. Essential hypertension Gina Norman will continue medications. She is working on healthy weight loss and exercise to improve blood pressure control. We will watch for signs of hypotension as she continues her lifestyle modifications.   3. Vitamin D deficiency Low Vitamin D level contributes to fatigue and are associated with obesity, breast, and colon cancer. We will check Vitamin D today. Gina Norman agrees to continue to take prescription Vitamin D 50,000 IU every week and she will follow-up for routine testing of Vitamin D, at least 2-3 times per year to avoid over-replacement.  - VITAMIN D 25 Hydroxy  (Vit-D Deficiency, Fractures)  4. At risk for diabetes mellitus Gina Norman was given approximately 15 minutes of diabetes education and counseling today. We discussed intensive lifestyle modifications today with an emphasis on weight loss as well as increasing exercise and decreasing simple carbohydrates in her diet. We also reviewed medication options with an emphasis on risk versus benefit of those discussed.   Repetitive spaced learning was employed today to elicit superior memory formation and behavioral change.   5. Obesity, current BMI 36.4 Gina Norman is currently in the action stage of change. As such, her goal is to continue with weight loss efforts. She has agreed to the Category 1 Plan.   Gina Norman will continue meal planning and intentional eating.  Exercise goals:  As is.  Behavioral modification strategies: increasing lean protein intake, decreasing simple carbohydrates, increasing vegetables, increasing water intake, decreasing eating out, no skipping meals, meal planning and cooking strategies, keeping healthy foods in the home, and planning for success.  Gina Norman has agreed to follow-up with our clinic in 2 weeks. She was informed of the importance of frequent follow-up visits to maximize her success with intensive lifestyle modifications for her multiple health conditions.   Gina Norman was informed we would discuss her lab results at her next visit unless there is a critical issue that needs to be addressed sooner. Gina Norman agreed to keep her next visit at the agreed upon time to discuss these results.  Objective:   Blood pressure 117/80, pulse 87, temperature 98.1 F (36.7 C), height 5\' 3"  (1.6 m), weight 205 lb (93 kg), last menstrual period 09/03/2014,  SpO2 99 %. Body mass index is 36.31 kg/m.  General: Cooperative, alert, well developed, in no acute distress. HEENT: Conjunctivae and lids unremarkable. Cardiovascular: Regular rhythm.  Lungs: Normal work of breathing. Neurologic: No  focal deficits.   Lab Results  Component Value Date   CREATININE 0.8 10/14/2020   BUN 16 10/14/2020   NA 140 10/14/2020   K 4.4 10/14/2020   CL 102 10/14/2020   CO2 28 (A) 10/14/2020   Lab Results  Component Value Date   ALT 49 (A) 10/14/2020   AST 42 (A) 10/14/2020   ALKPHOS 77 02/25/2020   BILITOT 0.5 02/25/2020   Lab Results  Component Value Date   HGBA1C 6.1 (H) 01/12/2021   HGBA1C 5.8 (H) 01/06/2020   Lab Results  Component Value Date   INSULIN 61.1 (H) 01/12/2021   INSULIN 32.7 (H) 01/06/2020   Lab Results  Component Value Date   TSH 2.850 01/06/2020   Lab Results  Component Value Date   CHOL 151 10/14/2020   HDL 40 10/14/2020   LDLCALC 86 10/14/2020   TRIG 144 10/14/2020   Lab Results  Component Value Date   VD25OH 23.1 (L) 01/12/2021   VD25OH 27.3 (L) 01/06/2020   Lab Results  Component Value Date   WBC 6.7 04/12/2018   HGB 12.8 04/12/2018   HCT 40.3 04/12/2018   MCV 98.5 04/12/2018   PLT 165 04/12/2018   No results found for: IRON, TIBC, FERRITIN  Attestation Statements:   Reviewed by clinician on day of visit: allergies, medications, problem list, medical history, surgical history, family history, social history, and previous encounter notes.  I, Jackson Latino, RMA, am acting as Energy manager for [employer], DO.   I have reviewed the above documentation for accuracy and completeness, and I agree with the above. Corinna Capra, DO

## 2021-03-24 LAB — COMPREHENSIVE METABOLIC PANEL
ALT: 40 IU/L — ABNORMAL HIGH (ref 0–32)
AST: 39 IU/L (ref 0–40)
Albumin/Globulin Ratio: 1.9 (ref 1.2–2.2)
Albumin: 4.9 g/dL (ref 3.8–4.9)
Alkaline Phosphatase: 72 IU/L (ref 44–121)
BUN/Creatinine Ratio: 15 (ref 9–23)
BUN: 13 mg/dL (ref 6–24)
Bilirubin Total: 0.7 mg/dL (ref 0.0–1.2)
CO2: 22 mmol/L (ref 20–29)
Calcium: 9.2 mg/dL (ref 8.7–10.2)
Chloride: 99 mmol/L (ref 96–106)
Creatinine, Ser: 0.85 mg/dL (ref 0.57–1.00)
Globulin, Total: 2.6 g/dL (ref 1.5–4.5)
Glucose: 92 mg/dL (ref 65–99)
Potassium: 3.8 mmol/L (ref 3.5–5.2)
Sodium: 138 mmol/L (ref 134–144)
Total Protein: 7.5 g/dL (ref 6.0–8.5)
eGFR: 81 mL/min/{1.73_m2} (ref >59)

## 2021-03-24 LAB — INSULIN, RANDOM: INSULIN: 48.3 u[IU]/mL — ABNORMAL HIGH (ref 2.6–24.9)

## 2021-03-24 LAB — VITAMIN D 25 HYDROXY (VIT D DEFICIENCY, FRACTURES): Vit D, 25-Hydroxy: 28.7 ng/mL — ABNORMAL LOW (ref 30.0–100.0)

## 2021-03-26 ENCOUNTER — Other Ambulatory Visit (HOSPITAL_BASED_OUTPATIENT_CLINIC_OR_DEPARTMENT_OTHER): Payer: Self-pay

## 2021-03-29 ENCOUNTER — Ambulatory Visit: Payer: BC Managed Care – PPO | Admitting: Internal Medicine

## 2021-03-29 ENCOUNTER — Encounter: Payer: Self-pay | Admitting: Internal Medicine

## 2021-03-29 ENCOUNTER — Other Ambulatory Visit: Payer: Self-pay

## 2021-03-29 VITALS — BP 136/84 | HR 92 | Ht 63.0 in | Wt 210.4 lb

## 2021-03-29 DIAGNOSIS — E781 Pure hyperglyceridemia: Secondary | ICD-10-CM | POA: Diagnosis not present

## 2021-03-29 DIAGNOSIS — E042 Nontoxic multinodular goiter: Secondary | ICD-10-CM | POA: Diagnosis not present

## 2021-03-29 DIAGNOSIS — E282 Polycystic ovarian syndrome: Secondary | ICD-10-CM | POA: Diagnosis not present

## 2021-03-29 NOTE — Progress Notes (Signed)
Name: Gina Norman  MRN/ DOB: [id number], 1965/10/22    Age/ Sex: 55 y.o., female     PCP: Alroy Dust, L.Marlou Sa, MD   Reason for Endocrinology Evaluation: PCOS     Initial Endocrinology Clinic Visit: 09/18/2019    PATIENT IDENTIFIER: Gina Norman is a 55 y.o., female with a past medical history of PCOS, Dyslipidemia and obesity . She has followed with [facility] since 09/18/2019 for consultative assistance with management of her PCOS  HISTORICAL SUMMARY:   Gina Norman has been diagnosed with PCOS over 20 years ago, historically she has had irregular menstruations requiring OCPs.  She has spent most of her life on OCPs, until 2017 she got scared of them and had to stop them as her aunt was diagnosed with thromboembolic event.  she did have to see an infertility specialist while trying to conceive, requiring hysteroscopies.  She has 2 sons the youngest was born in 2010.  She had gestational diabetes during her pregnancies.   She has not had any menstruation since 2017    Patient has hypertriglyceridemia with TG levels  > 400 mg/dL in 01/2019.  She was started on ezetimibe at the time with repeat TG and 1/21 at 350 mg/DL.  Patient does have fish oil but she forgets to take it.   Patient denies hirsutism, she does have a few dark hairs on the chin that she is able to plug when needed.    Thyroid ultrasound 03/2020 showed MNG   SUBJECTIVE:    Today (03/29/2021):  Gina Norman is here for a follow up on PCOS, hypertriglyceridemia and Thyromegaly   She continues to follow up with the [facility] and was started on Mounjaro 2.5 mg in 03/2021, she is having a lot of indigestion, and nausea, tried multiple OTC anatacids without help . She is currently on PPI which she believes its helping   She continues with back issues which limits her   Denies local neck swelling    Metformin 500 mg 2 tabs at night  Crestor 5 mg three times a week  Zetia 10 mg   Daily  Fish oil 1650 mg BID      HISTORY:  Past Medical History:  Past Medical History:  Diagnosis Date   ADD (attention deficit disorder)    Back pain    Back pain    Complication of anesthesia    hard to wake up   DDD (degenerative disc disease), lumbosacral    Female infertility    High cholesterol    Hypertension    Insulin resistance    Joint pain    Lynch syndrome    PCOS (polycystic ovarian syndrome)    Pre-diabetes    Sleep apnea    Wears contact lenses    Past Surgical History:  Past Surgical History:  Procedure Laterality Date   CARPAL TUNNEL RELEASE Left 08/14/2014   Procedure: LEFT CARPAL TUNNEL RELEASE;  Surgeon: Daryll Brod, MD;  Location: [place];  Service: Orthopedics;  Laterality: Left;  IV REGIONAL FAB   CARPAL TUNNEL RELEASE Right 09/23/2014   Procedure: RIGHT CARPAL TUNNEL RELEASE;  Surgeon: Daryll Brod, MD;  Location: [place];  Service: Orthopedics;  Laterality: Right;   ORIF ANKLE FRACTURE Left 04/12/2018   Procedure: OPEN REDUCTION INTERNAL FIXATION (ORIF) ANKLE FRACTURE;  Surgeon: Erle Crocker, MD;  Location: [place];  Service: Orthopedics;  Laterality: Left;   TONSILLECTOMY     Social  History:  reports that she quit smoking about 13 years ago. Her smoking use included cigarettes. She has a 5.00 pack-year smoking history. She has never used smokeless tobacco. She reports current alcohol use. She reports that she does not use drugs. Family History:  Family History  Problem Relation Age of Onset   Cancer Maternal Aunt        gallbladder cancer   Cancer Paternal Grandmother 8       breast cancer    Cancer Cousin        endometrial cancer   Cancer Cousin        breast cancer   Cancer Cousin        kidney cancer   Hypertension Mother    High Cholesterol Mother    Diabetes Father    High blood pressure Father    High Cholesterol Father    Sudden death Father      HOME MEDICATIONS: Allergies as of  03/29/2021       Reactions   Erythromycin Nausea Only   Upset stomach         Medication List        Accurate as of March 29, 2021  7:49 AM. If you have any questions, ask your nurse or doctor.          amphetamine-dextroamphetamine 15 MG tablet Commonly known as: ADDERALL Take 15 mg by mouth every morning.   cetirizine 10 MG tablet Commonly known as: ZYRTEC Take 10 mg by mouth daily.   ezetimibe 10 MG tablet Commonly known as: ZETIA Take 10 mg by mouth daily.   gabapentin 300 MG capsule Commonly known as: NEURONTIN Take 300-600 mg by mouth See admin instructions. 300 mg in the morning, 600 mg at bedtime   lisinopril-hydrochlorothiazide 10-12.5 MG tablet Commonly known as: ZESTORETIC Take 1 tablet by mouth daily.   Melatonin 2.5 MG Caps Take by mouth.   metFORMIN 500 MG 24 hr tablet Commonly known as: GLUCOPHAGE-XR Take 1 tablet (500 mg total) by mouth 3 (three) times daily.   metoprolol succinate 50 MG 24 hr tablet Commonly known as: TOPROL-XL Take 50 mg by mouth daily.   montelukast 10 MG tablet Commonly known as: SINGULAIR Take 10 mg by mouth at bedtime.   Naproxen Sodium 220 MG Caps Aleve   omega-3 acid ethyl esters 1 g capsule Commonly known as: LOVAZA Take by mouth 2 (two) times daily.   PROBIOTIC DAILY PO Take 1 capsule by mouth at bedtime.   rosuvastatin 5 MG tablet Commonly known as: CRESTOR Take 5 mg by mouth daily.   tirzepatide 2.5 MG/0.5ML Pen Commonly known as: MOUNJARO Inject 2.5 mg into the skin once a week.   Turmeric 450 MG Caps Take 1 capsule by mouth daily.   Vitamin D (Ergocalciferol) 1.25 MG (50000 UNIT) Caps capsule Commonly known as: DRISDOL Take 1 capsule (50,000 Units total) by mouth every 7 (seven) days.          OBJECTIVE:   PHYSICAL EXAM: VS: BP 136/84 (BP Location: Left Arm, Patient Position: Sitting, Cuff Size: Small)   Pulse 92   Ht _0  (1.6 m)   Wt 210 lb 6.4 oz (95.4 kg)   LMP 09/03/2014    SpO2 95%   BMI 37.27 kg/m    EXAM: General: Pt appears well and is in NAD  Neck: General: Supple without adenopathy. Thyroid: Thyroid size normal.  Left thyroid fullness noted   Lungs: Clear with good BS bilat with no rales,  rhonchi, or wheezes  Heart: Auscultation: RRR.  Abdomen: Normoactive bowel sounds, soft, nontender, without masses or organomegaly palpable  Extremities:  BL LE: No pretibial edema normal ROM and strength.  Mental Status: Judgment, insight: Intact Orientation: Oriented to time, place, and person Mood and affect: No depression, anxiety, or agitation     DATA REVIEWED:  Results for KATAYA, GUIMONT (MRN [id number]) as of 03/29/2021 07:35  Ref. Range 03/23/2021 08:12  Sodium Latest Ref Range: 134 - 144 mmol/L 138  Potassium Latest Ref Range: 3.5 - 5.2 mmol/L 3.8  Chloride Latest Ref Range: 96 - 106 mmol/L 99  CO2 Latest Ref Range: 20 - 29 mmol/L 22  Glucose Latest Ref Range: 65 - 99 mg/dL 92  BUN Latest Ref Range: 6 - 24 mg/dL 13  Creatinine Latest Ref Range: 0.57 - 1.00 mg/dL 0.85  Calcium Latest Ref Range: 8.7 - 10.2 mg/dL 9.2  BUN/Creatinine Ratio Latest Ref Range: 9 - 23  15  eGFR Latest Ref Range: >59 mL/min/1.73 81  Alkaline Phosphatase Latest Ref Range: 44 - 121 IU/L 72  Albumin Latest Ref Range: 3.8 - 4.9 g/dL 4.9  Albumin/Globulin Ratio Latest Ref Range: 1.2 - 2.2  1.9  AST Latest Ref Range: 0 - 40 IU/L 39  ALT Latest Ref Range: 0 - 32 IU/L 40 (H)  Total Protein Latest Ref Range: 6.0 - 8.5 g/dL 7.5  Total Bilirubin Latest Ref Range: 0.0 - 1.2 mg/dL 0.7  Results for SAMARRAH, TRANCHINA (MRN [id number]) as of 03/29/2021 07:35  Ref. Range 10/14/2020 00:00  Cholesterol Latest Ref Range: 0 - 200  151  HDL Cholesterol Latest Ref Range: 35 - 70  40  LDL (calc) Unknown 86  Triglycerides Latest Ref Range: 40 - 160  144    ASSESSMENT / PLAN / RECOMMENDATIONS:   Polycystic Ovary Syndrome ( PCOS)    After menopause the two key diagnostic criteria of PCOS  (irregular menses and polycystic ovaries on transvaginal ultrasound) are no longer applicable, because menopausal women have amenorrhea.  -The main issue with PCOS in the postmenopausal years, is hyperandrogenism , which she has no symptoms or sign of at this time. -Postmenopausal woman with PCOS are at a higher risk for developing type 2 diabetes, cardiovascular disease, and other metabolic disorders.  Our focus moving forward should be on her metabolic syndromes. - She is on Moujaro through the wellness and weight clinic for weight loss   2.  Impaired fasting glucose:   -Patient is at high risk for developing type 2 diabetes given her hx of  impaired fasting glucose, and history of gestational diabetes in the past. - She has not been able to tolerate higher doses of metformin, currently on 2 tabs daily  - A1c 6.1%      Medications : Metformin 500 mg XR, 2 tabs daily   3.  Hypertriglyceridemia:   - Her last Tg is trending down. She is not fasting today, she will have labs done at PCP's office next month   Continue Rosuvastatin 10 mg three times a week  Continue Zetia 10 mg daily  Continue Fish oil BID    4. MNG:  - Last year;s ultrasound shows MNG with 2 nodules meeting criteria for annual follow up  - Follow up ultrasound have been ordered  - No local neck symptoms  -  TSH and FT4 will be done at PCP's office next month      F/U in 1 yr     Signed electronically  by: Mack Guise, MD  [facility]  [facility] [address] [place], [place] [address] Phone: [phone] FAX: [phone]      CC: Aurea Graff.Marlou Sa, [place] [address] [employer] [address] [place] [address] Phone: [phone]  Fax: [phone]   Return to Endocrinology clinic as below: Future Appointments  Date Time Provider [place]  04/15/2021  7:40 AM Georgia Lopes, DO [facility] None

## 2021-04-13 ENCOUNTER — Other Ambulatory Visit: Payer: BC Managed Care – PPO

## 2021-04-15 ENCOUNTER — Encounter (INDEPENDENT_AMBULATORY_CARE_PROVIDER_SITE_OTHER): Payer: Self-pay | Admitting: Bariatrics

## 2021-04-15 ENCOUNTER — Other Ambulatory Visit: Payer: Self-pay

## 2021-04-15 ENCOUNTER — Other Ambulatory Visit (HOSPITAL_BASED_OUTPATIENT_CLINIC_OR_DEPARTMENT_OTHER): Payer: Self-pay

## 2021-04-15 ENCOUNTER — Ambulatory Visit (INDEPENDENT_AMBULATORY_CARE_PROVIDER_SITE_OTHER): Payer: BC Managed Care – PPO | Admitting: Bariatrics

## 2021-04-15 VITALS — BP 106/74 | HR 76 | Temp 98.0°F | Ht 63.0 in | Wt 205.0 lb

## 2021-04-15 DIAGNOSIS — E559 Vitamin D deficiency, unspecified: Secondary | ICD-10-CM

## 2021-04-15 DIAGNOSIS — R7303 Prediabetes: Secondary | ICD-10-CM | POA: Diagnosis not present

## 2021-04-15 DIAGNOSIS — I10 Essential (primary) hypertension: Secondary | ICD-10-CM

## 2021-04-15 DIAGNOSIS — Z6837 Body mass index (BMI) 37.0-37.9, adult: Secondary | ICD-10-CM

## 2021-04-15 MED ORDER — VITAMIN D (ERGOCALCIFEROL) 1.25 MG (50000 UNIT) PO CAPS: 50000.0000 [IU] | ORAL_CAPSULE | ORAL | 0 refills | Status: DC

## 2021-04-15 MED ORDER — TIRZEPATIDE 5 MG/0.5ML ~~LOC~~ SOAJ
5.0000 mg | SUBCUTANEOUS | 0 refills | Status: DC
  Filled 2021-04-15 – 2021-04-19 (×2): qty 2, 28d supply, fill #0

## 2021-04-15 NOTE — Progress Notes (Signed)
Chief Complaint:   OBESITY Gina Norman is here to discuss her progress with her obesity treatment plan along with follow-up of her obesity related diagnoses. Gina Norman is on the Category 1 Plan and states she is following her eating plan approximately 70% of the time. Gina Norman states she is walking for 2 miles 5 times per week.  Today's visit was #: 6 Starting weight: 213 lbs Starting date: 01/12/2021 Today's weight: 205 lbs Today's date: 04/15/2021 Total lbs lost to date: 8 lbs Total lbs lost since last in-office visit: 0  Interim History: Gina Norman's weight remain the same.  Subjective:   1. Vitamin D insufficiency Gina Norman is currently taking prescription Vitamin D.  2. Prediabetes Gina Norman has a diagnosis of prediabetes based on her elevated HgA1c and was informed this puts her at greater risk of developing diabetes. She continues to work on diet and exercise to decrease her risk of diabetes. She denies nausea or hypoglycemia. Her last A1C level wa 6.1. Her Insulin was 48.3.  3. Essential hypertension Gina Norman's blood pressure was increased. She is taking medications as instructed, no medication side effects noted, no chest pain on exertion, no dyspnea on exertion, no swelling of ankles.   Assessment/Plan:   1. Vitamin D insufficiency Low Vitamin D level contributes to fatigue and are associated with obesity, breast, and colon cancer. Gina Norman will continue Vitamin D. We will refill prescription Vitamin D 50,000 IU every week for 1 month with no refills and Gina Norman will follow-up for routine testing of Vitamin D, at least 2-3 times per year to avoid over-replacement.  - Vitamin D, Ergocalciferol, (DRISDOL) 1.25 MG (50000 UNIT) CAPS capsule; Take 1 capsule (50,000 Units total) by mouth every 7 (seven) days.  Dispense: 4 capsule; Refill: 0  2. Prediabetes Gina Norman will continue to work on weight loss, exercise, and decreasing simple carbohydrates to help decrease the risk of diabetes. We will refill  Mounjaro 5.0 mg for 1 month with no refills.   - tirzepatide [facility]) 5 MG/0.5ML Pen; Inject 5 mg into the skin once a week.  Dispense: 2 mL; Refill: 0  3. Essential hypertension Gina Norman will continue medications. She is working on healthy weight loss and exercise to improve blood pressure control. We will watch for signs of hypotension as she continues her lifestyle modifications.  4. Obesity, current BMI 36.3 Gina Norman is currently in the action stage of change. As such, her goal is to continue with weight loss efforts. She has agreed to the Category 1 Plan.   Gina Norman will continue meal planning and intentional eating. We will review labs from 03/23/2021.  Exercise goals:  As is.  Behavioral modification strategies: increasing lean protein intake, decreasing simple carbohydrates, increasing vegetables, increasing water intake, decreasing eating out, no skipping meals, meal planning and cooking strategies, keeping healthy foods in the home, and planning for success.  Gina Norman has agreed to follow-up with our clinic in 2 weeks. She was informed of the importance of frequent follow-up visits to maximize her success with intensive lifestyle modifications for her multiple health conditions.   Objective:   Blood pressure 106/74, pulse 76, temperature 98 F (36.7 C), height 5\' 3"  (1.6 m), weight 205 lb (93 kg), last menstrual period 09/03/2014, SpO2 97 %. Body mass index is 36.31 kg/m.  General: Cooperative, alert, well developed, in no acute distress. HEENT: Conjunctivae and lids unremarkable. Cardiovascular: Regular rhythm.  Lungs: Normal work of breathing. Neurologic: No focal deficits.   Lab Results  Component Value Date   CREATININE  0.85 03/23/2021   BUN 13 03/23/2021   NA 138 03/23/2021   K 3.8 03/23/2021   CL 99 03/23/2021   CO2 22 03/23/2021   Lab Results  Component Value Date   ALT 40 (H) 03/23/2021   AST 39 03/23/2021   ALKPHOS 72 03/23/2021   BILITOT 0.7 03/23/2021    Lab Results  Component Value Date   HGBA1C 6.1 (H) 01/12/2021   HGBA1C 5.8 (H) 01/06/2020   Lab Results  Component Value Date   INSULIN 48.3 (H) 03/23/2021   INSULIN 61.1 (H) 01/12/2021   INSULIN 32.7 (H) 01/06/2020   Lab Results  Component Value Date   TSH 2.850 01/06/2020   Lab Results  Component Value Date   CHOL 151 10/14/2020   HDL 40 10/14/2020   LDLCALC 86 10/14/2020   TRIG 144 10/14/2020   Lab Results  Component Value Date   VD25OH 28.7 (L) 03/23/2021   VD25OH 23.1 (L) 01/12/2021   VD25OH 27.3 (L) 01/06/2020   Lab Results  Component Value Date   WBC 6.7 04/12/2018   HGB 12.8 04/12/2018   HCT 40.3 04/12/2018   MCV 98.5 04/12/2018   PLT 165 04/12/2018   No results found for: IRON, TIBC, FERRITIN  Attestation Statements:   Reviewed by clinician on day of visit: allergies, medications, problem list, medical history, surgical history, family history, social history, and previous encounter notes.  I, Jackson Latino, RMA, am acting as Energy manager for [employer], DO.   I have reviewed the above documentation for accuracy and completeness, and I agree with the above. Corinna Capra, DO

## 2021-04-16 ENCOUNTER — Other Ambulatory Visit (HOSPITAL_BASED_OUTPATIENT_CLINIC_OR_DEPARTMENT_OTHER): Payer: Self-pay

## 2021-04-19 ENCOUNTER — Other Ambulatory Visit (HOSPITAL_BASED_OUTPATIENT_CLINIC_OR_DEPARTMENT_OTHER): Payer: Self-pay

## 2021-04-19 ENCOUNTER — Encounter (INDEPENDENT_AMBULATORY_CARE_PROVIDER_SITE_OTHER): Payer: Self-pay | Admitting: Bariatrics

## 2021-04-19 ENCOUNTER — Ambulatory Visit
Admission: RE | Admit: 2021-04-19 | Discharge: 2021-04-19 | Disposition: A | Discharge status: Home / Self Care | Payer: BC Managed Care – PPO | Source: Ambulatory Visit | Attending: Internal Medicine | Admitting: Internal Medicine

## 2021-04-19 DIAGNOSIS — E042 Nontoxic multinodular goiter: Secondary | ICD-10-CM

## 2021-04-19 DIAGNOSIS — E041 Nontoxic single thyroid nodule: Secondary | ICD-10-CM | POA: Diagnosis not present

## 2021-04-23 DIAGNOSIS — I10 Essential (primary) hypertension: Secondary | ICD-10-CM | POA: Diagnosis not present

## 2021-04-23 DIAGNOSIS — E042 Nontoxic multinodular goiter: Secondary | ICD-10-CM | POA: Diagnosis not present

## 2021-04-23 DIAGNOSIS — Z23 Encounter for immunization: Secondary | ICD-10-CM | POA: Diagnosis not present

## 2021-04-23 DIAGNOSIS — F900 Attention-deficit hyperactivity disorder, predominantly inattentive type: Secondary | ICD-10-CM | POA: Diagnosis not present

## 2021-04-23 DIAGNOSIS — E782 Mixed hyperlipidemia: Secondary | ICD-10-CM | POA: Diagnosis not present

## 2021-05-13 ENCOUNTER — Ambulatory Visit (INDEPENDENT_AMBULATORY_CARE_PROVIDER_SITE_OTHER): Payer: BC Managed Care – PPO | Admitting: Bariatrics

## 2021-05-13 ENCOUNTER — Other Ambulatory Visit: Payer: Self-pay

## 2021-05-13 ENCOUNTER — Other Ambulatory Visit (HOSPITAL_BASED_OUTPATIENT_CLINIC_OR_DEPARTMENT_OTHER): Payer: Self-pay

## 2021-05-13 ENCOUNTER — Encounter (INDEPENDENT_AMBULATORY_CARE_PROVIDER_SITE_OTHER): Payer: Self-pay | Admitting: Bariatrics

## 2021-05-13 VITALS — BP 121/85 | HR 90 | Temp 98.2°F | Ht 63.0 in | Wt 203.0 lb

## 2021-05-13 DIAGNOSIS — R7303 Prediabetes: Secondary | ICD-10-CM

## 2021-05-13 DIAGNOSIS — E559 Vitamin D deficiency, unspecified: Secondary | ICD-10-CM

## 2021-05-13 DIAGNOSIS — Z6837 Body mass index (BMI) 37.0-37.9, adult: Secondary | ICD-10-CM

## 2021-05-13 MED ORDER — TIRZEPATIDE 5 MG/0.5ML ~~LOC~~ SOAJ: 5.0000 mg | SUBCUTANEOUS | 0 refills | Status: DC

## 2021-05-13 MED ORDER — TIRZEPATIDE 5 MG/0.5ML ~~LOC~~ SOAJ
5.0000 mg | SUBCUTANEOUS | 0 refills | Status: DC
  Filled 2021-05-13: qty 2, 28d supply, fill #0

## 2021-05-13 MED ORDER — VITAMIN D (ERGOCALCIFEROL) 1.25 MG (50000 UNIT) PO CAPS: 50000.0000 [IU] | ORAL_CAPSULE | ORAL | 0 refills | Status: DC

## 2021-05-13 NOTE — Progress Notes (Signed)
Chief Complaint:   OBESITY Gina Norman is here to discuss her progress with her obesity treatment plan along with follow-up of her obesity related diagnoses. Gina Norman is on the Category 1 Plan and states she is following her eating plan approximately 70% of the time. Gina Norman states she is doing 0 minutes 0 times per week.  Today's visit was #: 7 Starting weight: 213 lbs Starting date: 01/12/2021 Today's weight: 203 lbs Today's date: 05/13/2021 Total lbs lost to date: 10 lbs Total lbs lost since last in-office visit: 2 lbs  Interim History: Gina Norman is down 2 lbs since her last visit.  Subjective:   1. Prediabetes Gina Norman states she occasionally take Pepcid to calm GI symptoms. She has a diagnosis of prediabetes based on her elevated HgA1c and was informed this puts her at greater risk of developing diabetes.   2. Vitamin D insufficiency Gina Norman is taking her medication as directed.   Assessment/Plan:   1. Prediabetes We will refill Mounjaro 5 mg with no refills. She will continue to eat her protein. Gina Norman will continue to work on weight loss, exercise, and decreasing simple carbohydrates to help decrease the risk of diabetes.   - tirzepatide [facility]) 5 MG/0.5ML Pen; Inject 5 mg into the skin once a week.  Dispense: 2 mL; Refill: 0  2. Vitamin D insufficiency Low Vitamin D level contributes to fatigue and are associated with obesity, breast, and colon cancer. We will refill prescription Vitamin D 50,000 IU every week for 1 month with no refills and Gina Norman will follow-up for routine testing of Vitamin D, at least 2-3 times per year to avoid over-replacement.  - Vitamin D, Ergocalciferol, (DRISDOL) 1.25 MG (50000 UNIT) CAPS capsule; Take 1 capsule (50,000 Units total) by mouth every 7 (seven) days.  Dispense: 4 capsule; Refill: 0  3. Obesity, current BMI 36.1 Gina Norman is currently in the action stage of change. As such, her goal is to continue with weight loss efforts. She has agreed to  the Category 1 Plan.   Gina Norman will continue meal planning and intentional eating.  Exercise goals: No exercise has been prescribed at this time.  Behavioral modification strategies: increasing lean protein intake, decreasing simple carbohydrates, increasing vegetables, increasing water intake, decreasing eating out, no skipping meals, meal planning and cooking strategies, keeping healthy foods in the home, and planning for success.  Gina Norman has agreed to follow-up with our clinic in 2-3 weeks. She was informed of the importance of frequent follow-up visits to maximize her success with intensive lifestyle modifications for her multiple health conditions.   Objective:   Blood pressure 121/85, pulse 90, temperature 98.2 F (36.8 C), height 5\' 3"  (1.6 m), weight 203 lb (92.1 kg), last menstrual period 09/03/2014, SpO2 98 %. Body mass index is 35.96 kg/m.  General: Cooperative, alert, well developed, in no acute distress. HEENT: Conjunctivae and lids unremarkable. Cardiovascular: Regular rhythm.  Lungs: Normal work of breathing. Neurologic: No focal deficits.   Lab Results  Component Value Date   CREATININE 0.85 03/23/2021   BUN 13 03/23/2021   NA 138 03/23/2021   K 3.8 03/23/2021   CL 99 03/23/2021   CO2 22 03/23/2021   Lab Results  Component Value Date   ALT 40 (H) 03/23/2021   AST 39 03/23/2021   ALKPHOS 72 03/23/2021   BILITOT 0.7 03/23/2021   Lab Results  Component Value Date   HGBA1C 6.1 (H) 01/12/2021   HGBA1C 5.8 (H) 01/06/2020   Lab Results  Component Value Date  INSULIN 48.3 (H) 03/23/2021   INSULIN 61.1 (H) 01/12/2021   INSULIN 32.7 (H) 01/06/2020   Lab Results  Component Value Date   TSH 2.850 01/06/2020   Lab Results  Component Value Date   CHOL 151 10/14/2020   HDL 40 10/14/2020   LDLCALC 86 10/14/2020   TRIG 144 10/14/2020   Lab Results  Component Value Date   VD25OH 28.7 (L) 03/23/2021   VD25OH 23.1 (L) 01/12/2021   VD25OH 27.3 (L)  01/06/2020   Lab Results  Component Value Date   WBC 6.7 04/12/2018   HGB 12.8 04/12/2018   HCT 40.3 04/12/2018   MCV 98.5 04/12/2018   PLT 165 04/12/2018   No results found for: IRON, TIBC, FERRITIN  Attestation Statements:   Reviewed by clinician on day of visit: allergies, medications, problem list, medical history, surgical history, family history, social history, and previous encounter notes.  I, Jackson Latino, RMA, am acting as Energy manager for [employer], DO.   I have reviewed the above documentation for accuracy and completeness, and I agree with the above. Gina Capra, DO

## 2021-05-17 ENCOUNTER — Encounter (INDEPENDENT_AMBULATORY_CARE_PROVIDER_SITE_OTHER): Payer: Self-pay | Admitting: Bariatrics

## 2021-05-26 ENCOUNTER — Other Ambulatory Visit: Payer: Self-pay | Admitting: Internal Medicine

## 2021-06-02 DIAGNOSIS — G4733 Obstructive sleep apnea (adult) (pediatric): Secondary | ICD-10-CM | POA: Diagnosis not present

## 2021-06-07 ENCOUNTER — Other Ambulatory Visit: Payer: Self-pay

## 2021-06-07 ENCOUNTER — Encounter (INDEPENDENT_AMBULATORY_CARE_PROVIDER_SITE_OTHER): Payer: Self-pay | Admitting: Bariatrics

## 2021-06-07 ENCOUNTER — Other Ambulatory Visit (HOSPITAL_BASED_OUTPATIENT_CLINIC_OR_DEPARTMENT_OTHER): Payer: Self-pay

## 2021-06-07 ENCOUNTER — Ambulatory Visit (INDEPENDENT_AMBULATORY_CARE_PROVIDER_SITE_OTHER): Payer: BC Managed Care – PPO | Admitting: Bariatrics

## 2021-06-07 VITALS — BP 116/81 | HR 89 | Temp 98.1°F | Ht 63.0 in | Wt 202.0 lb

## 2021-06-07 DIAGNOSIS — Z6837 Body mass index (BMI) 37.0-37.9, adult: Secondary | ICD-10-CM | POA: Diagnosis not present

## 2021-06-07 DIAGNOSIS — R7303 Prediabetes: Secondary | ICD-10-CM | POA: Diagnosis not present

## 2021-06-07 DIAGNOSIS — E559 Vitamin D deficiency, unspecified: Secondary | ICD-10-CM | POA: Diagnosis not present

## 2021-06-07 MED ORDER — TIRZEPATIDE 5 MG/0.5ML ~~LOC~~ SOAJ
5.0000 mg | SUBCUTANEOUS | 0 refills | Status: DC
  Filled 2021-06-07 – 2021-06-22 (×3): qty 2, 28d supply, fill #0

## 2021-06-07 MED ORDER — VITAMIN D (ERGOCALCIFEROL) 1.25 MG (50000 UNIT) PO CAPS: 50000.0000 [IU] | ORAL_CAPSULE | ORAL | 0 refills | Status: DC

## 2021-06-07 NOTE — Progress Notes (Signed)
Chief Complaint:   OBESITY Gina Norman is here to discuss her progress with her obesity treatment plan along with follow-up of her obesity related diagnoses. Gina Norman is on the Category 1 Plan and states she is following her eating plan approximately 80% of the time. Gina Norman states she is walking for 20-30 minutes 4 times per week.  Today's visit was #: 8 Starting weight: 213 lbs Starting date: 01/12/2021 Today's weight: 202 lbs Today's date: 06/07/2021 Total lbs lost to date: 11 lbs Total lbs lost since last in-office visit: 1 lb  Interim History: Gina Norman is down 1 lb.  Subjective:   1. Vitamin D insufficiency Gina Norman is taking her medications as directed.  2. Prediabetes Gina Norman is taking her medication as directed. She denies nausea but occasionally indigestion.   Assessment/Plan:   1. Vitamin D insufficiency Low Vitamin D level contributes to fatigue and are associated with obesity, breast, and colon cancer. We will refill prescription Vitamin D 50,000 IU every week for 1 month with no refills and Gina Norman will follow-up for routine testing of Vitamin D, at least 2-3 times per year to avoid over-replacement.  - Vitamin D, Ergocalciferol, (DRISDOL) 1.25 MG (50000 UNIT) CAPS capsule; Take 1 capsule (50,000 Units total) by mouth every 7 (seven) days.  Dispense: 4 capsule; Refill: 0  2. Prediabetes We will refill Mounjaro 5 mg with no refills. Gina Norman will continue to work on weight loss, exercise, and decreasing simple carbohydrates to help decrease the risk of diabetes.   - tirzepatide [facility]) 5 MG/0.5ML Pen; Inject 5 mg into the skin once a week.  Dispense: 2 mL; Refill: 0  3. Obesity, current BMI 35.8 Gina Norman is currently in the action stage of change. As such, her goal is to continue with weight loss efforts. She has agreed to the Category 1 Plan.   Gina Norman will continue meal planning. She will continue intentional eating.  Exercise goals:  Gina Norman will continue walking. She is  very active and NEAT.  Behavioral modification strategies: increasing lean protein intake, decreasing simple carbohydrates, increasing vegetables, increasing water intake, decreasing eating out, no skipping meals, meal planning and cooking strategies, keeping healthy foods in the home, and planning for success.  Gina Norman has agreed to follow-up with our clinic in 3 weeks with Gina Salvage, FNP or Gina Hamburger, NP and 6 weeks with myself. She was informed of the importance of frequent follow-up visits to maximize her success with intensive lifestyle modifications for her multiple health conditions.   Objective:   Blood pressure 116/81, pulse 89, temperature 98.1 F (36.7 C), height 5\' 3"  (1.6 m), weight 202 lb (91.6 kg), last menstrual period 09/03/2014, SpO2 99 %. Body mass index is 35.78 kg/m.  General: Cooperative, alert, well developed, in no acute distress. HEENT: Conjunctivae and lids unremarkable. Cardiovascular: Regular rhythm.  Lungs: Normal work of breathing. Neurologic: No focal deficits.   Lab Results  Component Value Date   CREATININE 0.85 03/23/2021   BUN 13 03/23/2021   NA 138 03/23/2021   K 3.8 03/23/2021   CL 99 03/23/2021   CO2 22 03/23/2021   Lab Results  Component Value Date   ALT 40 (H) 03/23/2021   AST 39 03/23/2021   ALKPHOS 72 03/23/2021   BILITOT 0.7 03/23/2021   Lab Results  Component Value Date   HGBA1C 6.1 (H) 01/12/2021   HGBA1C 5.8 (H) 01/06/2020   Lab Results  Component Value Date   INSULIN 48.3 (H) 03/23/2021   INSULIN 61.1 (H) 01/12/2021  INSULIN 32.7 (H) 01/06/2020   Lab Results  Component Value Date   TSH 2.850 01/06/2020   Lab Results  Component Value Date   CHOL 151 10/14/2020   HDL 40 10/14/2020   LDLCALC 86 10/14/2020   TRIG 144 10/14/2020   Lab Results  Component Value Date   VD25OH 28.7 (L) 03/23/2021   VD25OH 23.1 (L) 01/12/2021   VD25OH 27.3 (L) 01/06/2020   Lab Results  Component Value Date   WBC 6.7  04/12/2018   HGB 12.8 04/12/2018   HCT 40.3 04/12/2018   MCV 98.5 04/12/2018   PLT 165 04/12/2018   No results found for: IRON, TIBC, FERRITIN  Attestation Statements:   Reviewed by clinician on day of visit: allergies, medications, problem list, medical history, surgical history, family history, social history, and previous encounter notes.   I, Jackson Latino, RMA, am acting as Energy manager for [employer], DO.   I have reviewed the above documentation for accuracy and completeness, and I agree with the above. Corinna Capra, DO

## 2021-06-21 ENCOUNTER — Other Ambulatory Visit (HOSPITAL_BASED_OUTPATIENT_CLINIC_OR_DEPARTMENT_OTHER): Payer: Self-pay

## 2021-06-22 ENCOUNTER — Other Ambulatory Visit (HOSPITAL_BASED_OUTPATIENT_CLINIC_OR_DEPARTMENT_OTHER): Payer: Self-pay

## 2021-06-23 ENCOUNTER — Other Ambulatory Visit (HOSPITAL_BASED_OUTPATIENT_CLINIC_OR_DEPARTMENT_OTHER): Payer: Self-pay

## 2021-06-25 ENCOUNTER — Other Ambulatory Visit (HOSPITAL_BASED_OUTPATIENT_CLINIC_OR_DEPARTMENT_OTHER): Payer: Self-pay

## 2021-06-29 ENCOUNTER — Ambulatory Visit (INDEPENDENT_AMBULATORY_CARE_PROVIDER_SITE_OTHER): Payer: BC Managed Care – PPO | Admitting: Family Medicine

## 2021-06-29 ENCOUNTER — Encounter (INDEPENDENT_AMBULATORY_CARE_PROVIDER_SITE_OTHER): Payer: Self-pay | Admitting: Family Medicine

## 2021-06-29 ENCOUNTER — Other Ambulatory Visit (HOSPITAL_BASED_OUTPATIENT_CLINIC_OR_DEPARTMENT_OTHER): Payer: Self-pay

## 2021-06-29 ENCOUNTER — Other Ambulatory Visit: Payer: Self-pay

## 2021-06-29 VITALS — BP 125/80 | HR 92 | Temp 98.5°F | Ht 63.0 in | Wt 202.0 lb

## 2021-06-29 DIAGNOSIS — R7303 Prediabetes: Secondary | ICD-10-CM

## 2021-06-29 DIAGNOSIS — E559 Vitamin D deficiency, unspecified: Secondary | ICD-10-CM

## 2021-06-29 DIAGNOSIS — Z6837 Body mass index (BMI) 37.0-37.9, adult: Secondary | ICD-10-CM

## 2021-06-29 DIAGNOSIS — K219 Gastro-esophageal reflux disease without esophagitis: Secondary | ICD-10-CM | POA: Diagnosis not present

## 2021-06-29 MED ORDER — VITAMIN D (ERGOCALCIFEROL) 1.25 MG (50000 UNIT) PO CAPS: 50000.0000 [IU] | ORAL_CAPSULE | ORAL | 0 refills | Status: DC

## 2021-06-29 MED ORDER — TIRZEPATIDE 5 MG/0.5ML ~~LOC~~ SOAJ
5.0000 mg | SUBCUTANEOUS | 0 refills | Status: DC
  Filled 2021-06-29 – 2021-07-14 (×3): qty 2, 28d supply, fill #0

## 2021-06-29 NOTE — Progress Notes (Signed)
Chief Complaint:   OBESITY Gina Norman is here to discuss her progress with her obesity treatment plan along with follow-up of her obesity related diagnoses. Stephane is on the Category 1 Plan and states she is following her eating plan approximately 65% of the time. Irisa states she is doing yard work for 15 minutes 3 times per week and house work for 30 minutes 3 times per week.  Today's visit was #: 9 Starting weight: 213 lbs Starting date: 01/12/2021 Today's weight: 202 lbs Today's date: 06/29/2021 Total lbs lost to date: 11 lbs Total lbs lost since last in-office visit: 0  Interim History: Gina Norman is adhering to the plan well at breakfast and lunch but is not doing as well at dinner. She reports being busy and not able to plan and cook meals. The holidays have been a challenge due to abundant holiday treats.  Subjective:   1. Pre-diabetes Gina Norman is doing well on Mounjaro 5 mg weekly. She denies nausea and constipation.  Lab Results  Component Value Date   HGBA1C 6.1 (H) 01/12/2021   Lab Results  Component Value Date   INSULIN 48.3 (H) 03/23/2021   INSULIN 61.1 (H) 01/12/2021   INSULIN 32.7 (H) 01/06/2020    2. Gastroesophageal reflux disease  Aletta notes increased reflux with Mounjaro. She has been taking Omeprazole daily.   3. Vitamin D insufficiency Gina Norman's Vitamin D is low at 28.7. She is on weekly prescription Vitamin D.   Lab Results  Component Value Date   VD25OH 28.7 (L) 03/23/2021   VD25OH 23.1 (L) 01/12/2021   VD25OH 27.3 (L) 01/06/2020    Assessment/Plan:   1. Pre-diabetes We will refill Mounjaro 5 mg weekly. She is open to dropping dose to 2.5 mg Mounjaro if 5 mg is unavailable.   - tirzepatide [facility]) 5 MG/0.5ML Pen; Inject 5 mg into the skin once a week.  Dispense: 2 mL; Refill: 0  2. Gastroesophageal reflux disease   We will add OTC Pepcid and Tums PRN per package directions. She will continue Omeprazole daily.   3. Vitamin D  insufficiency We will refill prescription Vitamin D 50,000 IU every week and Marylyn will follow-up for routine testing of Vitamin D, at least 2-3 times per year to avoid over-replacement.  - Vitamin D, Ergocalciferol, (DRISDOL) 1.25 MG (50000 UNIT) CAPS capsule; Take 1 capsule (50,000 Units total) by mouth every 7 (seven) days.  Dispense: 4 capsule; Refill: 0  4. Obesity, current BMI 35.79 Gina Norman is currently in the action stage of change. As such, her goal is to continue with weight loss efforts. She has agreed to the Category 1 Plan.   Exercise goals:  As is.  Behavioral modification strategies: decreasing simple carbohydrates, increasing vegetables, decreasing liquid calories, and holiday eating strategies .  Kally has agreed to follow-up with our clinic in 3 weeks with Dr. Manson Passey.  Objective:   Blood pressure 125/80, pulse 92, temperature 98.5 F (36.9 C), height 5\' 3"  (1.6 m), weight 202 lb (91.6 kg), last menstrual period 09/03/2014, SpO2 99 %. Body mass index is 35.78 kg/m.  General: Cooperative, alert, well developed, in no acute distress. HEENT: Conjunctivae and lids unremarkable. Cardiovascular: Regular rhythm.  Lungs: Normal work of breathing. Neurologic: No focal deficits.   Lab Results  Component Value Date   CREATININE 0.85 03/23/2021   BUN 13 03/23/2021   NA 138 03/23/2021   K 3.8 03/23/2021   CL 99 03/23/2021   CO2 22 03/23/2021   Lab Results  Component Value Date   ALT 40 (H) 03/23/2021   AST 39 03/23/2021   ALKPHOS 72 03/23/2021   BILITOT 0.7 03/23/2021   Lab Results  Component Value Date   HGBA1C 6.1 (H) 01/12/2021   HGBA1C 5.8 (H) 01/06/2020   Lab Results  Component Value Date   INSULIN 48.3 (H) 03/23/2021   INSULIN 61.1 (H) 01/12/2021   INSULIN 32.7 (H) 01/06/2020   Lab Results  Component Value Date   TSH 2.850 01/06/2020   Lab Results  Component Value Date   CHOL 151 10/14/2020   HDL 40 10/14/2020   LDLCALC 86 10/14/2020   TRIG 144  10/14/2020   Lab Results  Component Value Date   VD25OH 28.7 (L) 03/23/2021   VD25OH 23.1 (L) 01/12/2021   VD25OH 27.3 (L) 01/06/2020   Lab Results  Component Value Date   WBC 6.7 04/12/2018   HGB 12.8 04/12/2018   HCT 40.3 04/12/2018   MCV 98.5 04/12/2018   PLT 165 04/12/2018   No results found for: IRON, TIBC, FERRITIN  Attestation Statements:   Reviewed by clinician on day of visit: allergies, medications, problem list, medical history, surgical history, family history, social history, and previous encounter notes.   I, Jackson Latino, RMA, am acting as Energy manager for [place], FNP.  I have reviewed the above documentation for accuracy and completeness, and I agree with the above. -  Jesse Sans, FNP

## 2021-07-12 ENCOUNTER — Other Ambulatory Visit (HOSPITAL_BASED_OUTPATIENT_CLINIC_OR_DEPARTMENT_OTHER): Payer: Self-pay

## 2021-07-14 ENCOUNTER — Other Ambulatory Visit (HOSPITAL_BASED_OUTPATIENT_CLINIC_OR_DEPARTMENT_OTHER): Payer: Self-pay

## 2021-07-21 ENCOUNTER — Encounter (INDEPENDENT_AMBULATORY_CARE_PROVIDER_SITE_OTHER): Payer: Self-pay | Admitting: Bariatrics

## 2021-07-21 ENCOUNTER — Other Ambulatory Visit: Payer: Self-pay

## 2021-07-21 ENCOUNTER — Ambulatory Visit (INDEPENDENT_AMBULATORY_CARE_PROVIDER_SITE_OTHER): Payer: BC Managed Care – PPO | Admitting: Bariatrics

## 2021-07-21 VITALS — BP 139/89 | HR 99 | Temp 98.3°F | Ht 63.0 in | Wt 202.0 lb

## 2021-07-21 DIAGNOSIS — E559 Vitamin D deficiency, unspecified: Secondary | ICD-10-CM | POA: Diagnosis not present

## 2021-07-21 DIAGNOSIS — R7303 Prediabetes: Secondary | ICD-10-CM

## 2021-07-21 DIAGNOSIS — I10 Essential (primary) hypertension: Secondary | ICD-10-CM | POA: Diagnosis not present

## 2021-07-21 DIAGNOSIS — E66812 Obesity, class 2: Secondary | ICD-10-CM

## 2021-07-21 DIAGNOSIS — Z6835 Body mass index (BMI) 35.0-35.9, adult: Secondary | ICD-10-CM

## 2021-07-21 MED ORDER — VITAMIN D (ERGOCALCIFEROL) 1.25 MG (50000 UNIT) PO CAPS: 50000.0000 [IU] | ORAL_CAPSULE | ORAL | 0 refills | Status: DC

## 2021-07-21 NOTE — Progress Notes (Signed)
Chief Complaint:   OBESITY Gina Norman is here to discuss her progress with her obesity treatment plan along with follow-up of her obesity related diagnoses. Gina Norman is on the Category 1 Plan and states she is following her eating plan approximately 65% of the time. Gina Norman states she is walking for 20 minutes 3 times per week.  Today's visit was #: 10 Starting weight: 213 lbs Starting date: 01/12/2021 Today's weight: 202 lbs Today's date: 07/21/2021 Total lbs lost to date: 11 lbs Total lbs lost since last in-office visit: 0  Interim History: Gina Norman's weight remains the same over the holidays.   Subjective:   1. Vitamin D insufficiency Gina Norman is taking her Vitamin D as directed.   2. Essential hypertension Gina Norman's blood pressure is controlled. It was slightly elevated today 125/80.  3. Pre-diabetes Gina Norman is taking Mounjaro and Prilosec OTC. She notes some indigestion.   Assessment/Plan:   1. Vitamin D insufficiency Low Vitamin D level contributes to fatigue and are associated with obesity, breast, and colon cancer. We will refill prescription Vitamin D 50,000 IU every week for 1 month with no refills and Gina Norman will follow-up for routine testing of Vitamin D, at least 2-3 times per year to avoid over-replacement. - Vitamin D, Ergocalciferol, (DRISDOL) 1.25 MG (50000 UNIT) CAPS capsule; Take 1 capsule (50,000 Units total) by mouth every 7 (seven) days.  Dispense: 4 capsule; Refill: 0  2. Essential hypertension Gina Norman will continue medications. She is working on healthy weight loss and exercise to improve blood pressure control. We will watch for signs of hypotension as she continues her lifestyle modifications.  3. Pre-diabetes Gina Norman will continue Mounjaro. She will continue to work on weight loss, exercise, and decreasing simple carbohydrates to help decrease the risk of diabetes.   4. Obesity, current BMI 35.9 Gina Norman is currently in the action stage of change. As such, her  goal is to continue with weight loss efforts. She has agreed to the Category 1 Plan.   Gina Norman will continue meal planning and she will adhere closely to the plan.  Exercise goals:  As is.  Behavioral modification strategies: increasing lean protein intake, decreasing simple carbohydrates, increasing vegetables, increasing water intake, decreasing eating out, no skipping meals, meal planning and cooking strategies, keeping healthy foods in the home, and planning for success.  Gina Norman has agreed to follow-up with our clinic in 3 weeks. She was informed of the importance of frequent follow-up visits to maximize her success with intensive lifestyle modifications for her multiple health conditions.   Objective:   Blood pressure 139/89, pulse 99, temperature 98.3 F (36.8 C), height 5\' 3"  (1.6 m), weight 202 lb (91.6 kg), last menstrual period 09/03/2014, SpO2 99 %. Body mass index is 35.78 kg/m.  General: Cooperative, alert, well developed, in no acute distress. HEENT: Conjunctivae and lids unremarkable. Cardiovascular: Regular rhythm.  Lungs: Normal work of breathing. Neurologic: No focal deficits.   Lab Results  Component Value Date   CREATININE 0.85 03/23/2021   BUN 13 03/23/2021   NA 138 03/23/2021   K 3.8 03/23/2021   CL 99 03/23/2021   CO2 22 03/23/2021   Lab Results  Component Value Date   ALT 40 (H) 03/23/2021   AST 39 03/23/2021   ALKPHOS 72 03/23/2021   BILITOT 0.7 03/23/2021   Lab Results  Component Value Date   HGBA1C 6.1 (H) 01/12/2021   HGBA1C 5.8 (H) 01/06/2020   Lab Results  Component Value Date   INSULIN 48.3 (H) 03/23/2021  INSULIN 61.1 (H) 01/12/2021   INSULIN 32.7 (H) 01/06/2020   Lab Results  Component Value Date   TSH 2.850 01/06/2020   Lab Results  Component Value Date   CHOL 151 10/14/2020   HDL 40 10/14/2020   LDLCALC 86 10/14/2020   TRIG 144 10/14/2020   Lab Results  Component Value Date   VD25OH 28.7 (L) 03/23/2021   VD25OH 23.1  (L) 01/12/2021   VD25OH 27.3 (L) 01/06/2020   Lab Results  Component Value Date   WBC 6.7 04/12/2018   HGB 12.8 04/12/2018   HCT 40.3 04/12/2018   MCV 98.5 04/12/2018   PLT 165 04/12/2018   No results found for: IRON, TIBC, FERRITIN  Attestation Statements:   Reviewed by clinician on day of visit: allergies, medications, problem list, medical history, surgical history, family history, social history, and previous encounter notes.  I, Jackson Latino, RMA, am acting as Energy manager for [employer], DO.  I have reviewed the above documentation for accuracy and completeness, and I agree with the above. Corinna Capra, DO

## 2021-07-25 ENCOUNTER — Encounter (INDEPENDENT_AMBULATORY_CARE_PROVIDER_SITE_OTHER): Payer: Self-pay | Admitting: Bariatrics

## 2021-08-09 DIAGNOSIS — G4733 Obstructive sleep apnea (adult) (pediatric): Secondary | ICD-10-CM | POA: Diagnosis not present

## 2021-08-18 ENCOUNTER — Ambulatory Visit (INDEPENDENT_AMBULATORY_CARE_PROVIDER_SITE_OTHER): Payer: BC Managed Care – PPO | Admitting: Bariatrics

## 2021-08-18 ENCOUNTER — Other Ambulatory Visit (HOSPITAL_BASED_OUTPATIENT_CLINIC_OR_DEPARTMENT_OTHER): Payer: Self-pay

## 2021-08-18 ENCOUNTER — Encounter (INDEPENDENT_AMBULATORY_CARE_PROVIDER_SITE_OTHER): Payer: Self-pay | Admitting: Bariatrics

## 2021-08-18 ENCOUNTER — Other Ambulatory Visit: Payer: Self-pay

## 2021-08-18 ENCOUNTER — Encounter (HOSPITAL_BASED_OUTPATIENT_CLINIC_OR_DEPARTMENT_OTHER): Payer: Self-pay | Admitting: Pharmacist

## 2021-08-18 VITALS — BP 124/83 | HR 76 | Temp 98.2°F | Ht 63.0 in | Wt 200.0 lb

## 2021-08-18 DIAGNOSIS — E669 Obesity, unspecified: Secondary | ICD-10-CM

## 2021-08-18 DIAGNOSIS — Z6835 Body mass index (BMI) 35.0-35.9, adult: Secondary | ICD-10-CM

## 2021-08-18 DIAGNOSIS — R7303 Prediabetes: Secondary | ICD-10-CM

## 2021-08-18 DIAGNOSIS — E559 Vitamin D deficiency, unspecified: Secondary | ICD-10-CM

## 2021-08-18 MED ORDER — VITAMIN D (ERGOCALCIFEROL) 1.25 MG (50000 UNIT) PO CAPS: 50000.0000 [IU] | ORAL_CAPSULE | ORAL | 0 refills | Status: DC

## 2021-08-18 MED ORDER — TIRZEPATIDE 7.5 MG/0.5ML ~~LOC~~ SOAJ
7.5000 mg | SUBCUTANEOUS | 0 refills | Status: DC
  Filled 2021-08-18: qty 2, 28d supply, fill #0

## 2021-08-18 NOTE — Progress Notes (Signed)
Chief Complaint:   OBESITY Gina Norman is here to discuss her progress with her obesity treatment plan along with follow-up of her obesity related diagnoses. [name] is on the Category 1 Plan and states she is following her eating plan approximately 75% of the time. [name] states she is walking for 15-20 minutes 3 times per week.  Today's visit was #: 11 Starting weight: 213 lbs Starting date: 01/12/2021 Today's weight: 200 lbs Today's date: 08/18/2021 Total lbs lost to date: 13 lbs Total lbs lost since last in-office visit: 2 lbs  Interim History: Gina Norman is down another 2 lbs since her last visit.  Subjective:   1. Vitamin D insufficiency [name] denies side effects.   2. Pre-diabetes [name] notes occasional indigestion. She is tolerating it well.   Assessment/Plan:   1. Vitamin D insufficiency Low Vitamin D level contributes to fatigue and are associated with obesity, breast, and colon cancer. We will refill prescription Vitamin D 50,000 IU every week for 1 month with no refills and [name] will follow-up for routine testing of Vitamin D, at least 2-3 times per year to avoid over-replacement.  - Vitamin D, Ergocalciferol, (DRISDOL) 1.25 MG (50000 UNIT) CAPS capsule; Take 1 capsule (50,000 Units total) by mouth every 7 (seven) days.  Dispense: 4 capsule; Refill: 0  2. Pre-diabetes We will refill Mounjaro 7.5 mg for 1 month with no refills. [name] will continue to work on weight loss, exercise, and decreasing simple carbohydrates to help decrease the risk of diabetes.   - tirzepatide (MOUNJARO) 7.5 MG/0.5ML Pen; Inject 7.5 mg into the skin once a week.  Dispense: 2 mL; Refill: 0  3. Obesity, current BMI 35.4 Gina Norman is currently in the action stage of change. As such, her goal is to continue with weight loss efforts. She has agreed to the Category 1 Plan.   [name] will continue meal planning and she will be mindful eating.  Exercise goals:  As is.  Behavioral modification  strategies: increasing lean protein intake, decreasing simple carbohydrates, increasing vegetables, increasing water intake, decreasing eating out, no skipping meals, meal planning and cooking strategies, keeping healthy foods in the home, and planning for success.  [name] has agreed to follow-up with our clinic in 3-4 weeks. She was informed of the importance of frequent follow-up visits to maximize her success with intensive lifestyle modifications for her multiple health conditions.   Objective:   Blood pressure 124/83, pulse 76, temperature 98.2 F (36.8 C), height 5\' 3"  (1.6 m), weight 200 lb (90.7 kg), last menstrual period 09/03/2014, SpO2 98 %. Body mass index is 35.43 kg/m.  General: Cooperative, alert, well developed, in no acute distress. HEENT: Conjunctivae and lids unremarkable. Cardiovascular: Regular rhythm.  Lungs: Normal work of breathing. Neurologic: No focal deficits.   Lab Results  Component Value Date   CREATININE 0.85 03/23/2021   BUN 13 03/23/2021   NA 138 03/23/2021   K 3.8 03/23/2021   CL 99 03/23/2021   CO2 22 03/23/2021   Lab Results  Component Value Date   ALT 40 (H) 03/23/2021   AST 39 03/23/2021   ALKPHOS 72 03/23/2021   BILITOT 0.7 03/23/2021   Lab Results  Component Value Date   HGBA1C 6.1 (H) 01/12/2021   HGBA1C 5.8 (H) 01/06/2020   Lab Results  Component Value Date   INSULIN 48.3 (H) 03/23/2021   INSULIN 61.1 (H) 01/12/2021   INSULIN 32.7 (H) 01/06/2020   Lab Results  Component Value Date   TSH 2.850 01/06/2020  Lab Results  Component Value Date   CHOL 151 10/14/2020   HDL 40 10/14/2020   LDLCALC 86 10/14/2020   TRIG 144 10/14/2020   Lab Results  Component Value Date   VD25OH 28.7 (L) 03/23/2021   VD25OH 23.1 (L) 01/12/2021   VD25OH 27.3 (L) 01/06/2020   Lab Results  Component Value Date   WBC 6.7 04/12/2018   HGB 12.8 04/12/2018   HCT 40.3 04/12/2018   MCV 98.5 04/12/2018   PLT 165 04/12/2018   No results found  for: IRON, TIBC, FERRITIN  Attestation Statements:   Reviewed by clinician on day of visit: allergies, medications, problem list, medical history, surgical history, family history, social history, and previous encounter notes.  I, Jackson Latino, RMA, am acting as Energy manager for [employer], DO.  I have reviewed the above documentation for accuracy and completeness, and I agree with the above. Corinna Capra, DO

## 2021-08-19 ENCOUNTER — Encounter (INDEPENDENT_AMBULATORY_CARE_PROVIDER_SITE_OTHER): Payer: Self-pay | Admitting: Bariatrics

## 2021-08-20 ENCOUNTER — Other Ambulatory Visit (HOSPITAL_BASED_OUTPATIENT_CLINIC_OR_DEPARTMENT_OTHER): Payer: Self-pay

## 2021-09-13 ENCOUNTER — Other Ambulatory Visit (HOSPITAL_BASED_OUTPATIENT_CLINIC_OR_DEPARTMENT_OTHER): Payer: Self-pay

## 2021-09-14 ENCOUNTER — Ambulatory Visit (INDEPENDENT_AMBULATORY_CARE_PROVIDER_SITE_OTHER): Payer: BC Managed Care – PPO | Admitting: Bariatrics

## 2021-09-14 ENCOUNTER — Other Ambulatory Visit: Payer: Self-pay

## 2021-09-14 ENCOUNTER — Other Ambulatory Visit (HOSPITAL_BASED_OUTPATIENT_CLINIC_OR_DEPARTMENT_OTHER): Payer: Self-pay

## 2021-09-14 ENCOUNTER — Encounter (INDEPENDENT_AMBULATORY_CARE_PROVIDER_SITE_OTHER): Payer: Self-pay | Admitting: Bariatrics

## 2021-09-14 ENCOUNTER — Encounter (HOSPITAL_BASED_OUTPATIENT_CLINIC_OR_DEPARTMENT_OTHER): Payer: Self-pay | Admitting: Pharmacist

## 2021-09-14 VITALS — BP 129/84 | HR 89 | Temp 97.8°F | Ht 63.0 in | Wt 199.0 lb

## 2021-09-14 DIAGNOSIS — I10 Essential (primary) hypertension: Secondary | ICD-10-CM

## 2021-09-14 DIAGNOSIS — Z6835 Body mass index (BMI) 35.0-35.9, adult: Secondary | ICD-10-CM

## 2021-09-14 DIAGNOSIS — Z6837 Body mass index (BMI) 37.0-37.9, adult: Secondary | ICD-10-CM

## 2021-09-14 DIAGNOSIS — E669 Obesity, unspecified: Secondary | ICD-10-CM | POA: Diagnosis not present

## 2021-09-14 DIAGNOSIS — R7303 Prediabetes: Secondary | ICD-10-CM

## 2021-09-14 MED ORDER — TIRZEPATIDE 7.5 MG/0.5ML ~~LOC~~ SOAJ
7.5000 mg | SUBCUTANEOUS | 0 refills | Status: DC
  Filled 2021-09-14: qty 2, 28d supply, fill #0

## 2021-09-14 NOTE — Progress Notes (Signed)
? ? ? ?Chief Complaint:  ? ?OBESITY ?Gina Norman is here to discuss her progress with her obesity treatment plan along with follow-up of her obesity related diagnoses. Gina Norman is on the Category 1 Plan and states she is following her eating plan approximately 80% of the time. Gina Norman states she is walking for 30 minutes 3 times per week. ? ?Today's visit was #: 12 ?Starting weight: 213 lbs ?Starting date: 01/12/2021 ?Today's weight: 199 lbs ?Today's date: 09/14/2021 ?Total lbs lost to date: 14 lbs ?Total lbs lost since last in-office visit: 1 lb ? ?Interim History: Gina Norman is down 1 lb since her last visit. She is doing okay with her water and protein intake. She is up about 1 1/2 lbs of water.  ? ?Subjective:  ? ?1. Pre-diabetes ?Gina Norman is taking Mounjaro currently.  ? ?2. Essential hypertension ?Gina Norman's blood pressure is controlled. Her last blood pressure was 124/83. ? ?Assessment/Plan:  ? ?1. Pre-diabetes ?We will refill Mounjaro 7.5 mg for 1 month with no refills. Gina Norman will continue to work on weight loss, exercise, and decreasing simple carbohydrates to help decrease the risk of diabetes.  ?- tirzepatide (MOUNJARO) 7.5 MG/0.5ML Pen; Inject 7.5 mg into the skin once a week.  Dispense: 2 mL; Refill: 0 ? ?2. Essential hypertension ?Gina Norman will continue taking her medications. She is working on healthy weight loss and exercise to improve blood pressure control. We will watch for signs of hypotension as she continues her lifestyle modifications. ? ?3. Obesity, current BMI 35.4 ?Gina Norman is currently in the action stage of change. As such, her goal is to continue with weight loss efforts. She has agreed to the Category 1 Plan.  ? ?Gina Norman will continue meal planning and she will continue intentional eating. She will increase her water intake.  ? ?Exercise goals:  As is.  ? ?Behavioral modification strategies: increasing lean protein intake, decreasing simple carbohydrates, increasing vegetables, increasing water intake,  decreasing eating out, no skipping meals, meal planning and cooking strategies, keeping healthy foods in the home, and planning for success. ? ?Gina Norman has agreed to follow-up with our clinic in 3 weeks. She was informed of the importance of frequent follow-up visits to maximize her success with intensive lifestyle modifications for her multiple health conditions.  ? ?Objective:  ? ?Blood pressure 129/84, pulse 89, temperature 97.8 ?F (36.6 ?C), height 5\' 3"  (1.6 m), weight 199 lb (90.3 kg), last menstrual period 09/03/2014, SpO2 100 %. ?Body mass index is 35.25 kg/m?. ? ?General: Cooperative, alert, well developed, in no acute distress. ?HEENT: Conjunctivae and lids unremarkable. ?Cardiovascular: Regular rhythm.  ?Lungs: Normal work of breathing. ?Neurologic: No focal deficits.  ? ?Lab Results  ?Component Value Date  ? CREATININE 0.85 03/23/2021  ? BUN 13 03/23/2021  ? NA 138 03/23/2021  ? K 3.8 03/23/2021  ? CL 99 03/23/2021  ? CO2 22 03/23/2021  ? ?Lab Results  ?Component Value Date  ? ALT 40 (H) 03/23/2021  ? AST 39 03/23/2021  ? ALKPHOS 72 03/23/2021  ? BILITOT 0.7 03/23/2021  ? ?Lab Results  ?Component Value Date  ? HGBA1C 6.1 (H) 01/12/2021  ? HGBA1C 5.8 (H) 01/06/2020  ? ?Lab Results  ?Component Value Date  ? INSULIN 48.3 (H) 03/23/2021  ? INSULIN 61.1 (H) 01/12/2021  ? INSULIN 32.7 (H) 01/06/2020  ? ?Lab Results  ?Component Value Date  ? TSH 2.850 01/06/2020  ? ?Lab Results  ?Component Value Date  ? CHOL 151 10/14/2020  ? HDL 40 10/14/2020  ? LDLCALC 86  10/14/2020  ? TRIG 144 10/14/2020  ? ?Lab Results  ?Component Value Date  ? VD25OH 28.7 (L) 03/23/2021  ? VD25OH 23.1 (L) 01/12/2021  ? VD25OH 27.3 (L) 01/06/2020  ? ?Lab Results  ?Component Value Date  ? WBC 6.7 04/12/2018  ? HGB 12.8 04/12/2018  ? HCT 40.3 04/12/2018  ? MCV 98.5 04/12/2018  ? PLT 165 04/12/2018  ? ?No results found for: IRON, TIBC, FERRITIN ? ?Attestation Statements:  ? ?Reviewed by clinician on day of visit: allergies, medications, problem  list, medical history, surgical history, family history, social history, and previous encounter notes. ? ?I, Jackson Latino, RMA, am acting as transcriptionist for [employer], DO. ? ?I have reviewed the above documentation for accuracy and completeness, and I agree with the above. Corinna Capra, DO ? ?

## 2021-09-16 ENCOUNTER — Encounter (INDEPENDENT_AMBULATORY_CARE_PROVIDER_SITE_OTHER): Payer: Self-pay | Admitting: Bariatrics

## 2021-09-16 ENCOUNTER — Other Ambulatory Visit (HOSPITAL_BASED_OUTPATIENT_CLINIC_OR_DEPARTMENT_OTHER): Payer: Self-pay

## 2021-09-20 ENCOUNTER — Other Ambulatory Visit (HOSPITAL_BASED_OUTPATIENT_CLINIC_OR_DEPARTMENT_OTHER): Payer: Self-pay

## 2021-10-13 ENCOUNTER — Encounter (INDEPENDENT_AMBULATORY_CARE_PROVIDER_SITE_OTHER): Payer: Self-pay | Admitting: Bariatrics

## 2021-10-13 ENCOUNTER — Ambulatory Visit (INDEPENDENT_AMBULATORY_CARE_PROVIDER_SITE_OTHER): Payer: BC Managed Care – PPO | Admitting: Bariatrics

## 2021-10-13 ENCOUNTER — Other Ambulatory Visit (HOSPITAL_BASED_OUTPATIENT_CLINIC_OR_DEPARTMENT_OTHER): Payer: Self-pay

## 2021-10-13 VITALS — BP 115/81 | HR 89 | Temp 98.2°F | Ht 63.0 in | Wt 197.0 lb

## 2021-10-13 DIAGNOSIS — E669 Obesity, unspecified: Secondary | ICD-10-CM

## 2021-10-13 DIAGNOSIS — R7303 Prediabetes: Secondary | ICD-10-CM | POA: Diagnosis not present

## 2021-10-13 DIAGNOSIS — Z6835 Body mass index (BMI) 35.0-35.9, adult: Secondary | ICD-10-CM

## 2021-10-13 DIAGNOSIS — E559 Vitamin D deficiency, unspecified: Secondary | ICD-10-CM | POA: Diagnosis not present

## 2021-10-13 MED ORDER — VITAMIN D (ERGOCALCIFEROL) 1.25 MG (50000 UNIT) PO CAPS: 50000.0000 [IU] | ORAL_CAPSULE | ORAL | 0 refills | Status: DC

## 2021-10-13 MED ORDER — TIRZEPATIDE 7.5 MG/0.5ML ~~LOC~~ SOAJ: 7.5000 mg | SUBCUTANEOUS | 0 refills | Status: DC

## 2021-10-13 MED ORDER — TIRZEPATIDE 7.5 MG/0.5ML ~~LOC~~ SOAJ
7.5000 mg | SUBCUTANEOUS | 0 refills | Status: DC
  Filled 2021-10-13: qty 2, 28d supply, fill #0

## 2021-10-13 NOTE — Progress Notes (Deleted)
? ? ? ? ?Chief Complaint:  ? ?OBESITY ?Gina Norman is here to discuss her progress with her obesity treatment plan along with follow-up of her obesity related diagnoses. Gina Norman is on {MWMwtlossportion/plan2:23431} and states she is following her eating plan approximately ***% of the time. Gina Norman states she is *** *** minutes *** times per week. ? ?Today's visit was #: *** ?Starting weight: *** ?Starting date: *** ?Today's weight: *** ?Today's date: 10/14/2021 ?Total lbs lost to date: *** ?Total lbs lost since last in-office visit: *** ? ?Interim History: *** ? ?Subjective:  ? ?1. Pre-diabetes ?*** ? ?2. Vitamin D insufficiency ?*** ? ?3. Obesity, current BMI 35.9 ?*** ? ? ?Assessment/Plan:  ? ?1. Pre-diabetes ?*** ?- tirzepatide (MOUNJARO) 7.5 MG/0.5ML Pen; Inject 7.5 mg into the skin once a week.  Dispense: 2 mL; Refill: 0 ? ?2. Vitamin D insufficiency ?*** ?- Vitamin D, Ergocalciferol, (DRISDOL) 1.25 MG (50000 UNIT) CAPS capsule; Take 1 capsule (50,000 Units total) by mouth every 7 (seven) days.  Dispense: 4 capsule; Refill: 0 ? ?3. Obesity, current BMI 35.9 ?*** ? ?Gina Norman {CHL AMB IS/IS NOT:210130109} currently in the action stage of change. As such, her goal is to {MWMwtloss#1:210800005}. She has agreed to {MWMwtlossportion/plan2:23431}.  ? ?Exercise goals: {MWM EXERCISE RECS:23473} ? ?Behavioral modification strategies: {MWMwtlossdietstrategies3:23432}. ? ?Gina Norman has agreed to follow-up with our clinic in {NUMBER 1-10:22536} weeks. She was informed of the importance of frequent follow-up visits to maximize her success with intensive lifestyle modifications for her multiple health conditions.  ? ?***delete paragraph if no labs orderedSherri was informed we would discuss her lab results at her next visit unless there is a critical issue that needs to be addressed sooner. Gina Norman agreed to keep her next visit at the agreed upon time to discuss these results. ? ?Objective:  ? ?Blood pressure 115/81, pulse 89, temperature  98.2 ?F (36.8 ?C), height 5\' 3"  (1.6 m), weight 197 lb (89.4 kg), last menstrual period 09/03/2014, SpO2 99 %. ?Body mass index is 34.9 kg/m?. ? ?General: Cooperative, alert, well developed, in no acute distress. ?HEENT: Conjunctivae and lids unremarkable. ?Cardiovascular: Regular rhythm.  ?Lungs: Normal work of breathing. ?Neurologic: No focal deficits.  ? ?Lab Results  ?Component Value Date  ? CREATININE 0.85 03/23/2021  ? BUN 13 03/23/2021  ? NA 138 03/23/2021  ? K 3.8 03/23/2021  ? CL 99 03/23/2021  ? CO2 22 03/23/2021  ? ?Lab Results  ?Component Value Date  ? ALT 40 (H) 03/23/2021  ? AST 39 03/23/2021  ? ALKPHOS 72 03/23/2021  ? BILITOT 0.7 03/23/2021  ? ?Lab Results  ?Component Value Date  ? HGBA1C 6.1 (H) 01/12/2021  ? HGBA1C 5.8 (H) 01/06/2020  ? ?Lab Results  ?Component Value Date  ? INSULIN 48.3 (H) 03/23/2021  ? INSULIN 61.1 (H) 01/12/2021  ? INSULIN 32.7 (H) 01/06/2020  ? ?Lab Results  ?Component Value Date  ? TSH 2.850 01/06/2020  ? ?Lab Results  ?Component Value Date  ? CHOL 151 10/14/2020  ? HDL 40 10/14/2020  ? LDLCALC 86 10/14/2020  ? TRIG 144 10/14/2020  ? ?Lab Results  ?Component Value Date  ? VD25OH 28.7 (L) 03/23/2021  ? VD25OH 23.1 (L) 01/12/2021  ? VD25OH 27.3 (L) 01/06/2020  ? ?Lab Results  ?Component Value Date  ? WBC 6.7 04/12/2018  ? HGB 12.8 04/12/2018  ? HCT 40.3 04/12/2018  ? MCV 98.5 04/12/2018  ? PLT 165 04/12/2018  ? ?No results found for: IRON, TIBC, FERRITIN ? ?Obesity Behavioral Intervention:  ? ?Approximately  15 minutes were spent on the discussion below. ? ?ASK: ?We discussed the diagnosis of obesity with Ellayna today and Gina Norman agreed to give [place] permission to discuss obesity behavioral modification therapy today. ? ?ASSESS: ?Gina Norman has the diagnosis of obesity and her BMI today is ***. Gina Norman {ACTION; IS/IS [id number] in the action stage of change.  ? ?ADVISE: ?Gina Norman was educated on the multiple health risks of obesity as well as the benefit of weight loss to improve her  health. She was advised of the need for long term treatment and the importance of lifestyle modifications to improve her current health and to decrease her risk of future health problems. ? ?AGREE: ?Multiple dietary modification options and treatment options were discussed and Gina Norman agreed to follow the recommendations documented in the above note. ? ?ARRANGE: ?Gina Norman was educated on the importance of frequent visits to treat obesity as outlined per CMS and USPSTF guidelines and agreed to schedule her next follow up appointment today. ? ?Attestation Statements:  ? ?Reviewed by clinician on day of visit: allergies, medications, problem list, medical history, surgical history, family history, social history, and previous encounter notes. ? ?***(delete if time-based billing not used)Time spent on visit including pre-visit chart review and post-visit care and charting was *** minutes.  ? ?I, ***, am acting as transcriptionist for ***. ? ?I have reviewed the above documentation for accuracy and completeness, and I agree with the above. -  *** ?

## 2021-10-14 NOTE — Progress Notes (Signed)
? ? ? ?Chief Complaint:  ? ?OBESITY ?Jaala is here to discuss her progress with her obesity treatment plan along with follow-up of her obesity related diagnoses. Dynasti is on the Category 1 Plan and states she is following her eating plan approximately 75% of the time. Francile states she is doing 0 minutes 0 times per week. ? ?Today's visit was #: 13 ?Starting weight: 213 lbs ?Starting date: 01/12/2021 ?Today's weight: 197 lbs ?Today's date: 10/13/2021 ?Total lbs lost to date: 16 lbs ?Total lbs lost since last in-office visit: 2 lbs ? ?Interim History: Lesleigh is down 2 lbs since her last visit. She is under more stress.  ? ?Subjective:  ? ?1. Pre-diabetes ?Dorthula is taking her medications as directed.  ? ?2. Vitamin D insufficiency ?Saryiah is getting minimal sun exposure.  ? ?Assessment/Plan:  ? ?1. Pre-diabetes ?We will refill Mounjaro 7.5 mg for 1 month with no refills. Ailyne will continue to work on weight loss, exercise, and decreasing simple carbohydrates to help decrease the risk of diabetes.  ? ?- tirzepatide (MOUNJARO) 7.5 MG/0.5ML Pen; Inject 7.5 mg into the skin once a week.  Dispense: 2 mL; Refill: 0 ? ?2. Vitamin D insufficiency ?Low Vitamin D level contributes to fatigue and are associated with obesity, breast, and colon cancer. We will refill prescription Vitamin D 50,000 IU every week for 1 month with no refills and Kaydence will follow-up for routine testing of Vitamin D, at least 2-3 times per year to avoid over-replacement. ? ?- Vitamin D, Ergocalciferol, (DRISDOL) 1.25 MG (50000 UNIT) CAPS capsule; Take 1 capsule (50,000 Units total) by mouth every 7 (seven) days.  Dispense: 4 capsule; Refill: 0 ? ?3. Obesity, current BMI 35.9 ?Shonta is currently in the action stage of change. As such, her goal is to continue with weight loss efforts. She has agreed to the Category 1 Plan.  ? ?Exercise goals: All adults should avoid inactivity. Some physical activity is better than none, and adults who participate in  any amount of physical activity gain some health benefits. Christiona will do some dancing and walking.  ? ?Behavioral modification strategies: increasing lean protein intake, decreasing simple carbohydrates, increasing vegetables, increasing water intake, decreasing eating out, no skipping meals, meal planning and cooking strategies, keeping healthy foods in the home, and planning for success. ? ?Danielys has agreed to follow-up with our clinic in 4 weeks. She was informed of the importance of frequent follow-up visits to maximize her success with intensive lifestyle modifications for her multiple health conditions.  ? ?Objective:  ? ?Blood pressure 115/81, pulse 89, temperature 98.2 ?F (36.8 ?C), height 5\' 3"  (1.6 m), weight 197 lb (89.4 kg), last menstrual period 09/03/2014, SpO2 99 %. ?Body mass index is 34.9 kg/m?. ? ?General: Cooperative, alert, well developed, in no acute distress. ?HEENT: Conjunctivae and lids unremarkable. ?Cardiovascular: Regular rhythm.  ?Lungs: Normal work of breathing. ?Neurologic: No focal deficits.  ? ?Lab Results  ?Component Value Date  ? CREATININE 0.85 03/23/2021  ? BUN 13 03/23/2021  ? NA 138 03/23/2021  ? K 3.8 03/23/2021  ? CL 99 03/23/2021  ? CO2 22 03/23/2021  ? ?Lab Results  ?Component Value Date  ? ALT 40 (H) 03/23/2021  ? AST 39 03/23/2021  ? ALKPHOS 72 03/23/2021  ? BILITOT 0.7 03/23/2021  ? ?Lab Results  ?Component Value Date  ? HGBA1C 6.1 (H) 01/12/2021  ? HGBA1C 5.8 (H) 01/06/2020  ? ?Lab Results  ?Component Value Date  ? INSULIN 48.3 (H) 03/23/2021  ?  INSULIN 61.1 (H) 01/12/2021  ? INSULIN 32.7 (H) 01/06/2020  ? ?Lab Results  ?Component Value Date  ? TSH 2.850 01/06/2020  ? ?Lab Results  ?Component Value Date  ? CHOL 151 10/14/2020  ? HDL 40 10/14/2020  ? LDLCALC 86 10/14/2020  ? TRIG 144 10/14/2020  ? ?Lab Results  ?Component Value Date  ? VD25OH 28.7 (L) 03/23/2021  ? VD25OH 23.1 (L) 01/12/2021  ? VD25OH 27.3 (L) 01/06/2020  ? ?Lab Results  ?Component Value Date  ? WBC 6.7  04/12/2018  ? HGB 12.8 04/12/2018  ? HCT 40.3 04/12/2018  ? MCV 98.5 04/12/2018  ? PLT 165 04/12/2018  ? ?No results found for: IRON, TIBC, FERRITIN ? ?Attestation Statements:  ? ?Reviewed by clinician on day of visit: allergies, medications, problem list, medical history, surgical history, family history, social history, and previous encounter notes. ? ?I, Jackson Latino, RMA, am acting as transcriptionist for [employer], DO. ? ?I have reviewed the above documentation for accuracy and completeness, and I agree with the above. Corinna Capra, DO ? ?

## 2021-10-15 ENCOUNTER — Encounter (HOSPITAL_BASED_OUTPATIENT_CLINIC_OR_DEPARTMENT_OTHER): Payer: Self-pay

## 2021-10-15 ENCOUNTER — Other Ambulatory Visit (HOSPITAL_BASED_OUTPATIENT_CLINIC_OR_DEPARTMENT_OTHER): Payer: Self-pay

## 2021-10-15 ENCOUNTER — Encounter (INDEPENDENT_AMBULATORY_CARE_PROVIDER_SITE_OTHER): Payer: Self-pay | Admitting: Bariatrics

## 2021-10-18 ENCOUNTER — Other Ambulatory Visit (INDEPENDENT_AMBULATORY_CARE_PROVIDER_SITE_OTHER): Payer: Self-pay | Admitting: Bariatrics

## 2021-10-18 ENCOUNTER — Encounter (INDEPENDENT_AMBULATORY_CARE_PROVIDER_SITE_OTHER): Payer: Self-pay | Admitting: Bariatrics

## 2021-10-18 ENCOUNTER — Other Ambulatory Visit (HOSPITAL_BASED_OUTPATIENT_CLINIC_OR_DEPARTMENT_OTHER): Payer: Self-pay

## 2021-10-18 MED ORDER — TIRZEPATIDE 10 MG/0.5ML ~~LOC~~ SOAJ
10.0000 mg | SUBCUTANEOUS | 0 refills | Status: DC
  Filled 2021-10-18: qty 2, 28d supply, fill #0

## 2021-10-18 NOTE — Telephone Encounter (Signed)
Last OV with Dr Brown 

## 2021-10-18 NOTE — Telephone Encounter (Signed)
Please review

## 2021-10-19 ENCOUNTER — Encounter (INDEPENDENT_AMBULATORY_CARE_PROVIDER_SITE_OTHER): Payer: Self-pay

## 2021-10-22 DIAGNOSIS — I10 Essential (primary) hypertension: Secondary | ICD-10-CM | POA: Diagnosis not present

## 2021-10-22 DIAGNOSIS — E669 Obesity, unspecified: Secondary | ICD-10-CM | POA: Diagnosis not present

## 2021-10-22 DIAGNOSIS — E782 Mixed hyperlipidemia: Secondary | ICD-10-CM | POA: Diagnosis not present

## 2021-10-22 DIAGNOSIS — J309 Allergic rhinitis, unspecified: Secondary | ICD-10-CM | POA: Diagnosis not present

## 2021-10-22 DIAGNOSIS — F900 Attention-deficit hyperactivity disorder, predominantly inattentive type: Secondary | ICD-10-CM | POA: Diagnosis not present

## 2021-11-09 ENCOUNTER — Other Ambulatory Visit (HOSPITAL_BASED_OUTPATIENT_CLINIC_OR_DEPARTMENT_OTHER): Payer: Self-pay

## 2021-11-09 ENCOUNTER — Encounter (INDEPENDENT_AMBULATORY_CARE_PROVIDER_SITE_OTHER): Payer: Self-pay | Admitting: Family Medicine

## 2021-11-09 ENCOUNTER — Ambulatory Visit (INDEPENDENT_AMBULATORY_CARE_PROVIDER_SITE_OTHER): Payer: BC Managed Care – PPO | Admitting: Family Medicine

## 2021-11-09 VITALS — BP 132/84 | HR 107 | Temp 98.1°F | Ht 63.0 in | Wt 198.0 lb

## 2021-11-09 DIAGNOSIS — Z9189 Other specified personal risk factors, not elsewhere classified: Secondary | ICD-10-CM

## 2021-11-09 DIAGNOSIS — E559 Vitamin D deficiency, unspecified: Secondary | ICD-10-CM

## 2021-11-09 DIAGNOSIS — E669 Obesity, unspecified: Secondary | ICD-10-CM

## 2021-11-09 DIAGNOSIS — K5909 Other constipation: Secondary | ICD-10-CM

## 2021-11-09 DIAGNOSIS — E1169 Type 2 diabetes mellitus with other specified complication: Secondary | ICD-10-CM

## 2021-11-09 DIAGNOSIS — E66812 Obesity, class 2: Secondary | ICD-10-CM

## 2021-11-09 DIAGNOSIS — Z6835 Body mass index (BMI) 35.0-35.9, adult: Secondary | ICD-10-CM

## 2021-11-09 DIAGNOSIS — Z7985 Long-term (current) use of injectable non-insulin antidiabetic drugs: Secondary | ICD-10-CM

## 2021-11-09 MED ORDER — POLYETHYLENE GLYCOL 3350 17 GM/SCOOP PO POWD
17.0000 g | Freq: Every day | ORAL | 0 refills | Status: DC
  Filled 2021-11-09: qty 238, 14d supply, fill #0

## 2021-11-09 MED ORDER — TIRZEPATIDE 10 MG/0.5ML ~~LOC~~ SOAJ
10.0000 mg | SUBCUTANEOUS | 0 refills | Status: DC
  Filled 2021-11-09: qty 2, 28d supply, fill #0

## 2021-11-09 MED ORDER — VITAMIN D (ERGOCALCIFEROL) 1.25 MG (50000 UNIT) PO CAPS
50000.0000 [IU] | ORAL_CAPSULE | ORAL | 0 refills | Status: DC
  Filled 2021-11-09: qty 4, 28d supply, fill #0

## 2021-11-12 ENCOUNTER — Other Ambulatory Visit (HOSPITAL_BASED_OUTPATIENT_CLINIC_OR_DEPARTMENT_OTHER): Payer: Self-pay

## 2021-11-15 ENCOUNTER — Other Ambulatory Visit (HOSPITAL_BASED_OUTPATIENT_CLINIC_OR_DEPARTMENT_OTHER): Payer: Self-pay

## 2021-11-24 NOTE — Progress Notes (Signed)
Chief Complaint:   OBESITY Gina Norman is here to discuss her progress with her obesity treatment plan along with follow-up of her obesity related diagnoses. Gina Norman is on the Category 1 Plan and states she is following her eating plan approximately 75% of the time. Gina Norman states she is walking 3 times per week.  Today's visit was #: 14 Starting weight: 213 lbs Starting date: 01/12/2021 Today's weight: 198 lbs Today's date: 11/09/2021 Total lbs lost to date: 15 Total lbs lost since last in-office visit: 0  Interim History: Gina Norman continues to work on her diet and weight loss. Her hunger is mostly controlled but not always.   Subjective:   1. Type 2 diabetes mellitus with other specified complication, without long-term current use of insulin (HCC) Jalin notes GERD symptoms with Mounjaro, which are controlled with medications. She continues  to work on her diet and exercise.   2. Other constipation Paul notes decreased BM frequency, and is likely associated with her GLP-1.  3. Vitamin D insufficiency Gina Norman is stable on Vitamin D, with no side effects noted.   4. At risk for heart disease Gina Norman is at higher than average risk for cardiovascular disease due to obesity.  Assessment/Plan:   1. Type 2 diabetes mellitus with other specified complication, without long-term current use of insulin (HCC) We will refill Mounjaro for 1 month. Good blood sugar control is important to decrease the likelihood of diabetic complications such as nephropathy, neuropathy, limb loss, blindness, coronary artery disease, and death. Intensive lifestyle modification including diet, exercise and weight loss are the first line of treatment for diabetes.   - tirzepatide (MOUNJARO) 10 MG/0.5ML Pen; Inject 10 mg into the skin once a week.  Dispense: 2 mL; Refill: 0  2. Other constipation Gina Norman agreed to start miralax 17 grams q daily with no refills. She was informed that a decrease in bowel movement  frequency is normal while losing weight, but stools should not be hard or painful. Orders and follow up as documented in patient record.   - polyethylene glycol powder (GLYCOLAX/MIRALAX) 17 GM/SCOOP powder; Take 17 g by mouth daily.  Dispense: 238 g; Refill: 0  3. Vitamin D insufficiency We will refill prescription Vitamin D for 1 month. Gina Norman will follow-up for routine testing of Vitamin D, at least 2-3 times per year to avoid over-replacement.  - Vitamin D, Ergocalciferol, (DRISDOL) 1.25 MG (50000 UNIT) CAPS capsule; Take 1 capsule (50,000 Units total) by mouth every 7 (seven) days.  Dispense: 4 capsule; Refill: 0  4. At risk for heart disease Gina Norman was given approximately 15 minutes of coronary artery disease prevention counseling today. She is 56 y.o. female and has risk factors for heart disease including obesity. We discussed intensive lifestyle modifications today with an emphasis on specific weight loss instructions and strategies.  Repetitive spaced learning was employed today to elicit superior memory formation and behavioral change.   5. Obesity, Current BMI 35.1 Gina Norman is currently in the action stage of change. As such, her goal is to continue with weight loss efforts. She has agreed to the Category 1 Plan.   Exercise goals: As is. Add strengthening exercise.  Behavioral modification strategies: increasing lean protein intake.  Gina Norman has agreed to follow-up with our clinic in 3 weeks. She was informed of the importance of frequent follow-up visits to maximize her success with intensive lifestyle modifications for her multiple health conditions.   Objective:   Blood pressure 132/84, pulse (!) 107, temperature 98.1 F (36.7  C), height 5\' 3"  (1.6 m), weight 198 lb (89.8 kg), last menstrual period 09/03/2014, SpO2 98 %. Body mass index is 35.07 kg/m.  General: Cooperative, alert, well developed, in no acute distress. HEENT: Conjunctivae and lids  unremarkable. Cardiovascular: Regular rhythm.  Lungs: Normal work of breathing. Neurologic: No focal deficits.   Lab Results  Component Value Date   CREATININE 0.85 03/23/2021   BUN 13 03/23/2021   NA 138 03/23/2021   K 3.8 03/23/2021   CL 99 03/23/2021   CO2 22 03/23/2021   Lab Results  Component Value Date   ALT 40 (H) 03/23/2021   AST 39 03/23/2021   ALKPHOS 72 03/23/2021   BILITOT 0.7 03/23/2021   Lab Results  Component Value Date   HGBA1C 6.1 (H) 01/12/2021   HGBA1C 5.8 (H) 01/06/2020   Lab Results  Component Value Date   INSULIN 48.3 (H) 03/23/2021   INSULIN 61.1 (H) 01/12/2021   INSULIN 32.7 (H) 01/06/2020   Lab Results  Component Value Date   TSH 2.850 01/06/2020   Lab Results  Component Value Date   CHOL 151 10/14/2020   HDL 40 10/14/2020   LDLCALC 86 10/14/2020   TRIG 144 10/14/2020   Lab Results  Component Value Date   VD25OH 28.7 (L) 03/23/2021   VD25OH 23.1 (L) 01/12/2021   VD25OH 27.3 (L) 01/06/2020   Lab Results  Component Value Date   WBC 6.7 04/12/2018   HGB 12.8 04/12/2018   HCT 40.3 04/12/2018   MCV 98.5 04/12/2018   PLT 165 04/12/2018   No results found for: IRON, TIBC, FERRITIN  Attestation Statements:   Reviewed by clinician on day of visit: allergies, medications, problem list, medical history, surgical history, family history, social history, and previous encounter notes.   I, 06/12/2018, am acting as transcriptionist for Burt Knack, MD.  I have reviewed the above documentation for accuracy and completeness, and I agree with the above. -  Quillian Quince, MD

## 2021-11-26 DIAGNOSIS — Z1231 Encounter for screening mammogram for malignant neoplasm of breast: Secondary | ICD-10-CM | POA: Diagnosis not present

## 2021-11-26 DIAGNOSIS — Z6834 Body mass index (BMI) 34.0-34.9, adult: Secondary | ICD-10-CM | POA: Diagnosis not present

## 2021-11-26 DIAGNOSIS — Z01419 Encounter for gynecological examination (general) (routine) without abnormal findings: Secondary | ICD-10-CM | POA: Diagnosis not present

## 2021-11-26 DIAGNOSIS — Z124 Encounter for screening for malignant neoplasm of cervix: Secondary | ICD-10-CM | POA: Diagnosis not present

## 2021-11-29 ENCOUNTER — Other Ambulatory Visit (HOSPITAL_BASED_OUTPATIENT_CLINIC_OR_DEPARTMENT_OTHER): Payer: Self-pay

## 2021-11-29 ENCOUNTER — Ambulatory Visit (INDEPENDENT_AMBULATORY_CARE_PROVIDER_SITE_OTHER): Payer: BC Managed Care – PPO | Admitting: Family Medicine

## 2021-11-29 ENCOUNTER — Encounter (HOSPITAL_BASED_OUTPATIENT_CLINIC_OR_DEPARTMENT_OTHER): Payer: Self-pay | Admitting: Pharmacist

## 2021-11-29 ENCOUNTER — Encounter (INDEPENDENT_AMBULATORY_CARE_PROVIDER_SITE_OTHER): Payer: Self-pay | Admitting: Family Medicine

## 2021-11-29 VITALS — BP 107/74 | HR 77 | Temp 98.4°F | Ht 63.0 in | Wt 200.0 lb

## 2021-11-29 DIAGNOSIS — E559 Vitamin D deficiency, unspecified: Secondary | ICD-10-CM

## 2021-11-29 DIAGNOSIS — Z6835 Body mass index (BMI) 35.0-35.9, adult: Secondary | ICD-10-CM

## 2021-11-29 DIAGNOSIS — Z9189 Other specified personal risk factors, not elsewhere classified: Secondary | ICD-10-CM

## 2021-11-29 DIAGNOSIS — E66812 Obesity, class 2: Secondary | ICD-10-CM

## 2021-11-29 DIAGNOSIS — E669 Obesity, unspecified: Secondary | ICD-10-CM | POA: Diagnosis not present

## 2021-11-29 DIAGNOSIS — E119 Type 2 diabetes mellitus without complications: Secondary | ICD-10-CM | POA: Insufficient documentation

## 2021-11-29 DIAGNOSIS — E1169 Type 2 diabetes mellitus with other specified complication: Secondary | ICD-10-CM | POA: Diagnosis not present

## 2021-11-29 DIAGNOSIS — Z7985 Long-term (current) use of injectable non-insulin antidiabetic drugs: Secondary | ICD-10-CM

## 2021-11-29 MED ORDER — TIRZEPATIDE 10 MG/0.5ML ~~LOC~~ SOAJ
10.0000 mg | SUBCUTANEOUS | 0 refills | Status: DC
  Filled 2021-11-29 – 2021-12-01 (×2): qty 2, 28d supply, fill #0

## 2021-11-29 MED ORDER — VITAMIN D (ERGOCALCIFEROL) 1.25 MG (50000 UNIT) PO CAPS
50000.0000 [IU] | ORAL_CAPSULE | ORAL | 0 refills | Status: DC
  Filled 2021-11-29 – 2021-12-01 (×2): qty 4, 28d supply, fill #0

## 2021-12-01 ENCOUNTER — Encounter (HOSPITAL_BASED_OUTPATIENT_CLINIC_OR_DEPARTMENT_OTHER): Payer: Self-pay

## 2021-12-01 ENCOUNTER — Other Ambulatory Visit (HOSPITAL_BASED_OUTPATIENT_CLINIC_OR_DEPARTMENT_OTHER): Payer: Self-pay

## 2021-12-03 ENCOUNTER — Other Ambulatory Visit (HOSPITAL_BASED_OUTPATIENT_CLINIC_OR_DEPARTMENT_OTHER): Payer: Self-pay

## 2021-12-08 DIAGNOSIS — G4733 Obstructive sleep apnea (adult) (pediatric): Secondary | ICD-10-CM | POA: Diagnosis not present

## 2021-12-13 NOTE — Progress Notes (Signed)
Chief Complaint:   OBESITY Gina Norman is here to discuss her progress with her obesity treatment plan along with follow-up of her obesity related diagnoses. Gina Norman is on the Category 1 Plan and states she is following her eating plan approximately 80% of the time. Gina Norman states she is walking and lifting weights for 20 minutes 3 times per week.  Today's visit was #: 15 Starting weight: 213 lbs Starting date: 01/12/2021 Today's weight: 200 lbs Today's date: 11/29/2021 Total lbs lost to date: 13 Total lbs lost since last in-office visit: 0  Interim History: Gina Norman has had some extra challenges since her last week. She is starting to get a bit frustrated. She is open to looking at other eating plans.  Subjective:   1. Type 2 diabetes mellitus with other specified complication, without long-term current use of insulin (HCC) Gina Norman is working on diet and exercise. She does have some constipation and GERD but this is improving.   2. Vitamin D insufficiency Gina Norman is stable on Vitamin D, with no side effects noted.   3. At risk for impaired metabolic function Gina Norman is at increased risk for impaired metabolic function if calories/protein decreases.   Assessment/Plan:   1. Type 2 diabetes mellitus with other specified complication, without long-term current use of insulin (HCC) We will refill Mounjaro for 1 month. Good blood sugar control is important to decrease the likelihood of diabetic complications such as nephropathy, neuropathy, limb loss, blindness, coronary artery disease, and death. Intensive lifestyle modification including diet, exercise and weight loss are the first line of treatment for diabetes.   - tirzepatide (MOUNJARO) 10 MG/0.5ML Pen; Inject 10 mg into the skin once a week.  Dispense: 2 mL; Refill: 0  2. Vitamin D insufficiency We will refill prescription Vitamin D for 1 month. Gina Norman will follow-up for routine testing of Vitamin D, at least 2-3 times per year to avoid  over-replacement.  - Vitamin D, Ergocalciferol, (DRISDOL) 1.25 MG (50000 UNIT) CAPS capsule; Take 1 capsule (50,000 Units total) by mouth every 7 (seven) days.  Dispense: 4 capsule; Refill: 0  3. At risk for impaired metabolic function Gina Norman was given approximately 15 minutes of impaired  metabolic function prevention counseling today. We discussed intensive lifestyle modifications today with an emphasis on specific nutrition and exercise instructions and strategies.   Repetitive spaced learning was employed today to elicit superior memory formation and behavioral change.  4. Obesity, Current BMI 35.5 Gina Norman is currently in the action stage of change. As such, her goal is to continue with weight loss efforts. She has agreed to change to keeping a food journal and adhering to recommended goals of 1100-1200 calories and 75+ grams of protein daily.   Exercise goals: As is.  Behavioral modification strategies: increasing lean protein intake and keeping a strict food journal.  Gina Norman has agreed to follow-up with our clinic in 3 weeks. She was informed of the importance of frequent follow-up visits to maximize her success with intensive lifestyle modifications for her multiple health conditions.   Objective:   Blood pressure 107/74, pulse 77, temperature 98.4 F (36.9 C), height 5\' 3"  (1.6 m), weight 200 lb (90.7 kg), last menstrual period 09/03/2014, SpO2 98 %. Body mass index is 35.43 kg/m.  General: Cooperative, alert, well developed, in no acute distress. HEENT: Conjunctivae and lids unremarkable. Cardiovascular: Regular rhythm.  Lungs: Normal work of breathing. Neurologic: No focal deficits.   Lab Results  Component Value Date   CREATININE 0.85 03/23/2021  BUN 13 03/23/2021   NA 138 03/23/2021   K 3.8 03/23/2021   CL 99 03/23/2021   CO2 22 03/23/2021   Lab Results  Component Value Date   ALT 40 (H) 03/23/2021   AST 39 03/23/2021   ALKPHOS 72 03/23/2021   BILITOT 0.7  03/23/2021   Lab Results  Component Value Date   HGBA1C 6.1 (H) 01/12/2021   HGBA1C 5.8 (H) 01/06/2020   Lab Results  Component Value Date   INSULIN 48.3 (H) 03/23/2021   INSULIN 61.1 (H) 01/12/2021   INSULIN 32.7 (H) 01/06/2020   Lab Results  Component Value Date   TSH 2.850 01/06/2020   Lab Results  Component Value Date   CHOL 151 10/14/2020   HDL 40 10/14/2020   LDLCALC 86 10/14/2020   TRIG 144 10/14/2020   Lab Results  Component Value Date   VD25OH 28.7 (L) 03/23/2021   VD25OH 23.1 (L) 01/12/2021   VD25OH 27.3 (L) 01/06/2020   Lab Results  Component Value Date   WBC 6.7 04/12/2018   HGB 12.8 04/12/2018   HCT 40.3 04/12/2018   MCV 98.5 04/12/2018   PLT 165 04/12/2018   No results found for: IRON, TIBC, FERRITIN  Attestation Statements:   Reviewed by clinician on day of visit: allergies, medications, problem list, medical history, surgical history, family history, social history, and previous encounter notes.   I, Burt Knack, am acting as transcriptionist for Quillian Quince, MD.  I have reviewed the above documentation for accuracy and completeness, and I agree with the above. -  Quillian Quince, MD

## 2021-12-17 DIAGNOSIS — E782 Mixed hyperlipidemia: Secondary | ICD-10-CM | POA: Diagnosis not present

## 2021-12-17 DIAGNOSIS — K625 Hemorrhage of anus and rectum: Secondary | ICD-10-CM | POA: Diagnosis not present

## 2021-12-17 DIAGNOSIS — F900 Attention-deficit hyperactivity disorder, predominantly inattentive type: Secondary | ICD-10-CM | POA: Diagnosis not present

## 2021-12-17 DIAGNOSIS — R197 Diarrhea, unspecified: Secondary | ICD-10-CM | POA: Diagnosis not present

## 2021-12-21 ENCOUNTER — Other Ambulatory Visit (HOSPITAL_BASED_OUTPATIENT_CLINIC_OR_DEPARTMENT_OTHER): Payer: Self-pay

## 2021-12-22 ENCOUNTER — Encounter (INDEPENDENT_AMBULATORY_CARE_PROVIDER_SITE_OTHER): Payer: Self-pay | Admitting: Bariatrics

## 2021-12-22 ENCOUNTER — Ambulatory Visit (INDEPENDENT_AMBULATORY_CARE_PROVIDER_SITE_OTHER): Payer: BC Managed Care – PPO | Admitting: Bariatrics

## 2021-12-22 ENCOUNTER — Other Ambulatory Visit (HOSPITAL_BASED_OUTPATIENT_CLINIC_OR_DEPARTMENT_OTHER): Payer: Self-pay

## 2021-12-22 VITALS — BP 97/66 | HR 80 | Temp 97.9°F | Ht 63.0 in | Wt 199.0 lb

## 2021-12-22 DIAGNOSIS — R632 Polyphagia: Secondary | ICD-10-CM

## 2021-12-22 DIAGNOSIS — Z7985 Long-term (current) use of injectable non-insulin antidiabetic drugs: Secondary | ICD-10-CM

## 2021-12-22 DIAGNOSIS — Z6835 Body mass index (BMI) 35.0-35.9, adult: Secondary | ICD-10-CM

## 2021-12-22 DIAGNOSIS — E66812 Obesity, class 2: Secondary | ICD-10-CM

## 2021-12-22 DIAGNOSIS — E669 Obesity, unspecified: Secondary | ICD-10-CM

## 2021-12-22 DIAGNOSIS — E559 Vitamin D deficiency, unspecified: Secondary | ICD-10-CM

## 2021-12-22 DIAGNOSIS — Z6837 Body mass index (BMI) 37.0-37.9, adult: Secondary | ICD-10-CM

## 2021-12-22 DIAGNOSIS — R7303 Prediabetes: Secondary | ICD-10-CM | POA: Diagnosis not present

## 2021-12-22 MED ORDER — VITAMIN D (ERGOCALCIFEROL) 1.25 MG (50000 UNIT) PO CAPS
50000.0000 [IU] | ORAL_CAPSULE | ORAL | 0 refills | Status: DC
  Filled 2021-12-22 – 2021-12-28 (×2): qty 4, 28d supply, fill #0

## 2021-12-22 MED ORDER — TIRZEPATIDE 5 MG/0.5ML ~~LOC~~ SOAJ
5.0000 mg | SUBCUTANEOUS | 0 refills | Status: DC
  Filled 2021-12-22 – 2021-12-23 (×2): qty 2, 28d supply, fill #0

## 2021-12-22 NOTE — Progress Notes (Signed)
Chief Complaint:   OBESITY Gina Norman is here to discuss her progress with her obesity treatment plan along with follow-up of her obesity related diagnoses. Gina Norman is on the Category 1 Plan and states she is following her eating plan approximately 70% of the time. [name] states she is walking for 15-20 minutes 3 times per week.  Today's visit was #: 16 Starting weight: 213 lbs Starting date: 01/12/2021 Today's weight: 199 lbs Today's date: 12/22/2021 Total lbs lost to date: 14 lbs Total lbs lost since last in-office visit: 1 lb  Interim History: Gina Norman is down 1 lb from her last visit. She has been sick.   Subjective:   1. Prediabetes [name] is currently on Mounjaro 10 mg. She had 2 episodes for hemorrhagic diarrhea. She was okay on lower dose.   2. Polyphagia Gina Norman notes a normal appetite. She does note a slightly increase since on Mounjaro.   3. Vitamin D insufficiency Gina Norman is taking Vitamin D currently.   Assessment/Plan:   1. Prediabetes Gina Norman will watch her triggers for diarrhea, nausea, and vomiting. She will decrease Mounjaro from 10 mg to 5 mg. We will refill Mounjaro 5 mg for 1 month with no refills. She will continue to work on weight loss, exercise, and decreasing simple carbohydrates to help decrease the risk of diabetes.   - tirzepatide [facility]) 5 MG/0.5ML Pen; Inject 5 mg into the skin once a week.  Dispense: 2 mL; Refill: 0  2. Polyphagia Intensive lifestyle modifications are the first line treatment for this issue. Paralee will resume Mounjaro. We discussed several lifestyle modifications today and she will continue to work on diet, exercise and weight loss efforts. Orders and follow up as documented in patient record.  Counseling Polyphagia is excessive hunger. Causes can include: low blood sugars, hypERthyroidism, PMS, lack of sleep, stress, insulin resistance, diabetes, certain medications, and diets that are deficient in protein and fiber.    3.  Vitamin D insufficiency Low Vitamin D level contributes to fatigue and are associated with obesity, breast, and colon cancer. We will refill prescription Vitamin D 50,000 IU every week for 1 month with no refills and Gina Norman will follow-up for routine testing of Vitamin D, at least 2-3 times per year to avoid over-replacement.  - Vitamin D, Ergocalciferol, (DRISDOL) 1.25 MG (50000 UNIT) CAPS capsule; Take 1 capsule (50,000 Units total) by mouth every 7 (seven) days.  Dispense: 4 capsule; Refill: 0  4. Obesity, Current BMI 35.4 Gina Norman is currently in the action stage of change. As such, her goal is to continue with weight loss efforts. She has agreed to the Category 1 Plan.   Gina Norman will continue meal planning. She will adhere to the plan 80-90%.  Exercise goals:  As is.   Behavioral modification strategies: increasing lean protein intake, decreasing simple carbohydrates, increasing vegetables, increasing water intake, decreasing eating out, no skipping meals, meal planning and cooking strategies, keeping healthy foods in the home, and planning for success.  Gina Norman has agreed to follow-up with our clinic in 4 weeks. She was informed of the importance of frequent follow-up visits to maximize her success with intensive lifestyle modifications for her multiple health conditions.   Objective:   Blood pressure 97/66, pulse 80, temperature 97.9 F (36.6 C), height 5\' 3"  (1.6 m), weight 199 lb (90.3 kg), last menstrual period 09/03/2014, SpO2 97 %. Body mass index is 35.25 kg/m.  General: Cooperative, alert, well developed, in no acute distress. HEENT: Conjunctivae and lids unremarkable. Cardiovascular: Regular  rhythm.  Lungs: Normal work of breathing. Neurologic: No focal deficits.   Lab Results  Component Value Date   CREATININE 0.85 03/23/2021   BUN 13 03/23/2021   NA 138 03/23/2021   K 3.8 03/23/2021   CL 99 03/23/2021   CO2 22 03/23/2021   Lab Results  Component Value Date   ALT  40 (H) 03/23/2021   AST 39 03/23/2021   ALKPHOS 72 03/23/2021   BILITOT 0.7 03/23/2021   Lab Results  Component Value Date   HGBA1C 6.1 (H) 01/12/2021   HGBA1C 5.8 (H) 01/06/2020   Lab Results  Component Value Date   INSULIN 48.3 (H) 03/23/2021   INSULIN 61.1 (H) 01/12/2021   INSULIN 32.7 (H) 01/06/2020   Lab Results  Component Value Date   TSH 2.850 01/06/2020   Lab Results  Component Value Date   CHOL 151 10/14/2020   HDL 40 10/14/2020   LDLCALC 86 10/14/2020   TRIG 144 10/14/2020   Lab Results  Component Value Date   VD25OH 28.7 (L) 03/23/2021   VD25OH 23.1 (L) 01/12/2021   VD25OH 27.3 (L) 01/06/2020   Lab Results  Component Value Date   WBC 6.7 04/12/2018   HGB 12.8 04/12/2018   HCT 40.3 04/12/2018   MCV 98.5 04/12/2018   PLT 165 04/12/2018   No results found for: "IRON", "TIBC", "FERRITIN"  Attestation Statements:   Reviewed by clinician on day of visit: allergies, medications, problem list, medical history, surgical history, family history, social history, and previous encounter notes.  I, Jackson Latino, RMA, am acting as Energy manager for [employer], DO.  I have reviewed the above documentation for accuracy and completeness, and I agree with the above. Corinna Capra, DO

## 2021-12-23 ENCOUNTER — Other Ambulatory Visit (HOSPITAL_BASED_OUTPATIENT_CLINIC_OR_DEPARTMENT_OTHER): Payer: Self-pay

## 2021-12-27 ENCOUNTER — Encounter (INDEPENDENT_AMBULATORY_CARE_PROVIDER_SITE_OTHER): Payer: Self-pay | Admitting: Bariatrics

## 2021-12-28 ENCOUNTER — Other Ambulatory Visit (HOSPITAL_BASED_OUTPATIENT_CLINIC_OR_DEPARTMENT_OTHER): Payer: Self-pay

## 2021-12-29 DIAGNOSIS — G4733 Obstructive sleep apnea (adult) (pediatric): Secondary | ICD-10-CM | POA: Diagnosis not present

## 2022-01-06 ENCOUNTER — Other Ambulatory Visit (HOSPITAL_BASED_OUTPATIENT_CLINIC_OR_DEPARTMENT_OTHER): Payer: Self-pay

## 2022-01-06 MED ORDER — AMPHETAMINE-DEXTROAMPHETAMINE 15 MG PO TABS
ORAL_TABLET | ORAL | 0 refills | Status: DC
Start: 2022-01-06 — End: 2022-02-04
  Filled 2022-01-06: qty 60, 30d supply, fill #0

## 2022-01-17 ENCOUNTER — Encounter (INDEPENDENT_AMBULATORY_CARE_PROVIDER_SITE_OTHER): Payer: Self-pay | Admitting: Bariatrics

## 2022-01-17 ENCOUNTER — Other Ambulatory Visit (HOSPITAL_BASED_OUTPATIENT_CLINIC_OR_DEPARTMENT_OTHER): Payer: Self-pay

## 2022-01-17 ENCOUNTER — Ambulatory Visit (INDEPENDENT_AMBULATORY_CARE_PROVIDER_SITE_OTHER): Payer: BC Managed Care – PPO | Admitting: Bariatrics

## 2022-01-17 VITALS — BP 112/79 | HR 80 | Temp 98.1°F | Ht 63.0 in | Wt 202.0 lb

## 2022-01-17 DIAGNOSIS — Z6835 Body mass index (BMI) 35.0-35.9, adult: Secondary | ICD-10-CM

## 2022-01-17 DIAGNOSIS — E669 Obesity, unspecified: Secondary | ICD-10-CM | POA: Diagnosis not present

## 2022-01-17 DIAGNOSIS — R7303 Prediabetes: Secondary | ICD-10-CM | POA: Diagnosis not present

## 2022-01-17 DIAGNOSIS — E559 Vitamin D deficiency, unspecified: Secondary | ICD-10-CM

## 2022-01-17 MED ORDER — TIRZEPATIDE 5 MG/0.5ML ~~LOC~~ SOAJ
5.0000 mg | SUBCUTANEOUS | 0 refills | Status: DC
  Filled 2022-01-17: qty 2, 28d supply, fill #0

## 2022-01-17 MED ORDER — VITAMIN D (ERGOCALCIFEROL) 1.25 MG (50000 UNIT) PO CAPS
50000.0000 [IU] | ORAL_CAPSULE | ORAL | 0 refills | Status: DC
  Filled 2022-01-17: qty 4, 28d supply, fill #0

## 2022-01-17 NOTE — Progress Notes (Unsigned)
Chief Complaint:   OBESITY Gina Norman is here to discuss her progress with her obesity treatment plan along with follow-up of her obesity related diagnoses. Gina Norman is on the Category 1 Plan and states she is following her eating plan approximately 70% of the time. Gina Norman states she is walking for 15 minutes 1 time per week.  Today's visit was #: 17 Starting weight: 213 lbs Starting date: 01/12/2021 Today's weight: 202 lbs Today's date: 01/17/2022 Total lbs lost to date: 11 Total lbs lost since last in-office visit: 0  Interim History: Gina Norman is up 3 pounds since her last visit.  Her indigestion has improved and she is doing better with the [facility].  She has had celebrations.  Subjective:   1. Prediabetes Gina Norman's GI symptoms have improved with Mounjaro 5 mg.   2. Vitamin D insufficiency Gina Norman is taking Vitamin D prescription.   Assessment/Plan:   1. Prediabetes Gina Norman will continue Mounjaro 5 mg once weekly, and we will refill for 1 month.  - tirzepatide [facility]) 5 MG/0.5ML Pen; Inject 5 mg into the skin once a week.  Dispense: 2 mL; Refill: 0  2. Vitamin D insufficiency Gina Norman will continue prescription Vitamin D 50,000 IU every week, and we will refill for 1 month. She will decrease acidic foods.   - Vitamin D, Ergocalciferol, (DRISDOL) 1.25 MG (50000 UNIT) CAPS capsule; Take 1 capsule (50,000 Units total) by mouth every 7 (seven) days.  Dispense: 4 capsule; Refill: 0  3. Obesity, Current BMI 35.8 Gina Norman is currently in the action stage of change. As such, her goal is to continue with weight loss efforts. She has agreed to the Category 1 Plan.   Meal planning and intentional eating were discussed.  Exercise goals: As is.   Behavioral modification strategies: increasing lean protein intake, decreasing simple carbohydrates, increasing vegetables, increasing water intake, decreasing eating out, no skipping meals, meal planning and cooking strategies, keeping healthy  foods in the home, and planning for success.  Gina Norman has agreed to follow-up with our clinic in 4 weeks. She was informed of the importance of frequent follow-up visits to maximize her success with intensive lifestyle modifications for her multiple health conditions.   Objective:   Blood pressure 112/79, pulse 80, temperature 98.1 F (36.7 C), height 5\' 3"  (1.6 m), weight 202 lb (91.6 kg), last menstrual period 09/03/2014, SpO2 98 %. Body mass index is 35.78 kg/m.  General: Cooperative, alert, well developed, in no acute distress. HEENT: Conjunctivae and lids unremarkable. Cardiovascular: Regular rhythm.  Lungs: Normal work of breathing. Neurologic: No focal deficits.   Lab Results  Component Value Date   CREATININE 0.85 03/23/2021   BUN 13 03/23/2021   NA 138 03/23/2021   K 3.8 03/23/2021   CL 99 03/23/2021   CO2 22 03/23/2021   Lab Results  Component Value Date   ALT 40 (H) 03/23/2021   AST 39 03/23/2021   ALKPHOS 72 03/23/2021   BILITOT 0.7 03/23/2021   Lab Results  Component Value Date   HGBA1C 6.1 (H) 01/12/2021   HGBA1C 5.8 (H) 01/06/2020   Lab Results  Component Value Date   INSULIN 48.3 (H) 03/23/2021   INSULIN 61.1 (H) 01/12/2021   INSULIN 32.7 (H) 01/06/2020   Lab Results  Component Value Date   TSH 2.850 01/06/2020   Lab Results  Component Value Date   CHOL 151 10/14/2020   HDL 40 10/14/2020   LDLCALC 86 10/14/2020   TRIG 144 10/14/2020   Lab Results  Component Value Date   VD25OH 28.7 (L) 03/23/2021   VD25OH 23.1 (L) 01/12/2021   VD25OH 27.3 (L) 01/06/2020   Lab Results  Component Value Date   WBC 6.7 04/12/2018   HGB 12.8 04/12/2018   HCT 40.3 04/12/2018   MCV 98.5 04/12/2018   PLT 165 04/12/2018   No results found for: "IRON", "TIBC", "FERRITIN"  Attestation Statements:   Reviewed by clinician on day of visit: allergies, medications, problem list, medical history, surgical history, family history, social history, and previous  encounter notes.   Trude Mcburney, am acting as Energy manager for [employer], DO.  I have reviewed the above documentation for accuracy and completeness, and I agree with the above. Corinna Capra, DO

## 2022-01-18 ENCOUNTER — Encounter (INDEPENDENT_AMBULATORY_CARE_PROVIDER_SITE_OTHER): Payer: Self-pay | Admitting: Bariatrics

## 2022-01-18 NOTE — Telephone Encounter (Signed)
Dr.Brown 

## 2022-01-26 ENCOUNTER — Other Ambulatory Visit (HOSPITAL_BASED_OUTPATIENT_CLINIC_OR_DEPARTMENT_OTHER): Payer: Self-pay

## 2022-01-27 ENCOUNTER — Other Ambulatory Visit (HOSPITAL_BASED_OUTPATIENT_CLINIC_OR_DEPARTMENT_OTHER): Payer: Self-pay

## 2022-02-04 ENCOUNTER — Encounter (HOSPITAL_BASED_OUTPATIENT_CLINIC_OR_DEPARTMENT_OTHER): Payer: Self-pay | Admitting: Pharmacist

## 2022-02-04 ENCOUNTER — Other Ambulatory Visit (HOSPITAL_BASED_OUTPATIENT_CLINIC_OR_DEPARTMENT_OTHER): Payer: Self-pay

## 2022-02-04 MED ORDER — AMPHETAMINE-DEXTROAMPHETAMINE 15 MG PO TABS
ORAL_TABLET | ORAL | 0 refills | Status: DC
Start: 2022-02-04 — End: 2022-02-22
  Filled 2022-02-04: qty 60, 30d supply, fill #0

## 2022-02-07 ENCOUNTER — Other Ambulatory Visit (HOSPITAL_BASED_OUTPATIENT_CLINIC_OR_DEPARTMENT_OTHER): Payer: Self-pay

## 2022-02-14 ENCOUNTER — Ambulatory Visit (INDEPENDENT_AMBULATORY_CARE_PROVIDER_SITE_OTHER): Payer: BC Managed Care – PPO | Admitting: Bariatrics

## 2022-02-14 ENCOUNTER — Encounter (HOSPITAL_BASED_OUTPATIENT_CLINIC_OR_DEPARTMENT_OTHER): Payer: Self-pay | Admitting: Pharmacist

## 2022-02-14 ENCOUNTER — Other Ambulatory Visit (HOSPITAL_BASED_OUTPATIENT_CLINIC_OR_DEPARTMENT_OTHER): Payer: Self-pay

## 2022-02-14 ENCOUNTER — Encounter (INDEPENDENT_AMBULATORY_CARE_PROVIDER_SITE_OTHER): Payer: Self-pay | Admitting: Bariatrics

## 2022-02-14 VITALS — BP 105/72 | HR 81 | Temp 97.9°F | Ht 63.0 in | Wt 204.0 lb

## 2022-02-14 DIAGNOSIS — E559 Vitamin D deficiency, unspecified: Secondary | ICD-10-CM

## 2022-02-14 DIAGNOSIS — E669 Obesity, unspecified: Secondary | ICD-10-CM | POA: Diagnosis not present

## 2022-02-14 DIAGNOSIS — Z6836 Body mass index (BMI) 36.0-36.9, adult: Secondary | ICD-10-CM | POA: Diagnosis not present

## 2022-02-14 DIAGNOSIS — R7303 Prediabetes: Secondary | ICD-10-CM

## 2022-02-14 MED ORDER — VITAMIN D (ERGOCALCIFEROL) 1.25 MG (50000 UNIT) PO CAPS
50000.0000 [IU] | ORAL_CAPSULE | ORAL | 0 refills | Status: DC
  Filled 2022-02-14: qty 4, 28d supply, fill #0

## 2022-02-14 MED ORDER — WEGOVY 0.5 MG/0.5ML ~~LOC~~ SOAJ
0.5000 mg | SUBCUTANEOUS | 0 refills | Status: DC
  Filled 2022-02-14: qty 2, 28d supply, fill #0

## 2022-02-15 ENCOUNTER — Other Ambulatory Visit (HOSPITAL_BASED_OUTPATIENT_CLINIC_OR_DEPARTMENT_OTHER): Payer: Self-pay

## 2022-02-16 ENCOUNTER — Encounter (INDEPENDENT_AMBULATORY_CARE_PROVIDER_SITE_OTHER): Payer: Self-pay

## 2022-02-17 ENCOUNTER — Other Ambulatory Visit (HOSPITAL_BASED_OUTPATIENT_CLINIC_OR_DEPARTMENT_OTHER): Payer: Self-pay

## 2022-02-18 ENCOUNTER — Other Ambulatory Visit (HOSPITAL_BASED_OUTPATIENT_CLINIC_OR_DEPARTMENT_OTHER): Payer: Self-pay

## 2022-02-21 NOTE — Progress Notes (Unsigned)
Chief Complaint:   OBESITY Gina Norman is here to discuss her progress with her obesity treatment plan along with follow-up of her obesity related diagnoses. Gina Norman is on the Category 1 Plan and states she is following her eating plan approximately 70% of the time. Gina Norman states she is doing yoga for 10 minutes 3 times per week.  Today's visit was #: 18 Starting weight: 213 lbs Starting date: 01/12/2021 Today's weight: 204 lbs Today's date: 02/14/2022 Total lbs lost to date: 9 Total lbs lost since last in-office visit: 0  Interim History: Gina Norman is up 2 pounds since her last visit.  Her appetite is higher due to stopping Mounjaro.  Subjective:   1. Vitamin D insufficiency Gina Norman is taking Vitamin D.   2. Prediabetes Gina Norman stopped paying for the Prilosec.   Assessment/Plan:   1. Vitamin D insufficiency Gina Norman will continue prescription Vitamin D 50,000 IU once weekly, and we will refill for 1 month.   - Vitamin D, Ergocalciferol, (DRISDOL) 1.25 MG (50000 UNIT) CAPS capsule; Take 1 capsule (50,000 Units total) by mouth every 7 (seven) days.  Dispense: 4 capsule; Refill: 0  2. Prediabetes Gina Norman agreed to start Wegovy 0.5 mg once weekly with no refills. We will follow-up at her next visit.  - Semaglutide-Weight Management (WEGOVY) 0.5 MG/0.5ML SOAJ; Inject 0.5 mg into the skin once a week.  Dispense: 2 mL; Refill: 0  3. Obesity, Current BMI 36.2 Gina Norman is currently in the action stage of change. As such, her goal is to continue with weight loss efforts. She has agreed to the Category 1 Plan.   She will increase her fiber (vegetables).  She will start [facility].  Exercise goals: As is.  Behavioral modification strategies: increasing lean protein intake, decreasing simple carbohydrates, increasing vegetables, increasing water intake, decreasing eating out, no skipping meals, meal planning and cooking strategies, keeping healthy foods in the home, and planning for success.  Gina Norman  has agreed to follow-up with our clinic in 4 weeks. She was informed of the importance of frequent follow-up visits to maximize her success with intensive lifestyle modifications for her multiple health conditions.   Objective:   Blood pressure 105/72, pulse 81, temperature 97.9 F (36.6 C), height 5\' 3"  (1.6 m), weight 204 lb (92.5 kg), last menstrual period 09/03/2014, SpO2 98 %. Body mass index is 36.14 kg/m.  General: Cooperative, alert, well developed, in no acute distress. HEENT: Conjunctivae and lids unremarkable. Cardiovascular: Regular rhythm.  Lungs: Normal work of breathing. Neurologic: No focal deficits.   Lab Results  Component Value Date   CREATININE 0.85 03/23/2021   BUN 13 03/23/2021   NA 138 03/23/2021   K 3.8 03/23/2021   CL 99 03/23/2021   CO2 22 03/23/2021   Lab Results  Component Value Date   ALT 40 (H) 03/23/2021   AST 39 03/23/2021   ALKPHOS 72 03/23/2021   BILITOT 0.7 03/23/2021   Lab Results  Component Value Date   HGBA1C 6.1 (H) 01/12/2021   HGBA1C 5.8 (H) 01/06/2020   Lab Results  Component Value Date   INSULIN 48.3 (H) 03/23/2021   INSULIN 61.1 (H) 01/12/2021   INSULIN 32.7 (H) 01/06/2020   Lab Results  Component Value Date   TSH 2.850 01/06/2020   Lab Results  Component Value Date   CHOL 151 10/14/2020   HDL 40 10/14/2020   LDLCALC 86 10/14/2020   TRIG 144 10/14/2020   Lab Results  Component Value Date   VD25OH 28.7 (L) 03/23/2021  VD25OH 23.1 (L) 01/12/2021   VD25OH 27.3 (L) 01/06/2020   Lab Results  Component Value Date   WBC 6.7 04/12/2018   HGB 12.8 04/12/2018   HCT 40.3 04/12/2018   MCV 98.5 04/12/2018   PLT 165 04/12/2018   No results found for: "IRON", "TIBC", "FERRITIN"  Attestation Statements:   Reviewed by clinician on day of visit: allergies, medications, problem list, medical history, surgical history, family history, social history, and previous encounter notes.   Trude Mcburney, am acting as  Energy manager for [employer], DO.  I have reviewed the above documentation for accuracy and completeness, and I agree with the above. Corinna Capra, DO

## 2022-03-15 ENCOUNTER — Other Ambulatory Visit (HOSPITAL_BASED_OUTPATIENT_CLINIC_OR_DEPARTMENT_OTHER): Payer: Self-pay

## 2022-03-15 MED ORDER — AMPHETAMINE-DEXTROAMPHETAMINE 15 MG PO TABS
15.0000 mg | ORAL_TABLET | Freq: Two times a day (BID) | ORAL | 0 refills | Status: DC | PRN
  Filled 2022-03-15: qty 60, 30d supply, fill #0

## 2022-03-16 ENCOUNTER — Other Ambulatory Visit (HOSPITAL_BASED_OUTPATIENT_CLINIC_OR_DEPARTMENT_OTHER): Payer: Self-pay

## 2022-03-17 ENCOUNTER — Ambulatory Visit (INDEPENDENT_AMBULATORY_CARE_PROVIDER_SITE_OTHER): Payer: BC Managed Care – PPO | Admitting: Bariatrics

## 2022-03-30 ENCOUNTER — Ambulatory Visit: Payer: BC Managed Care – PPO | Admitting: Internal Medicine

## 2022-03-30 ENCOUNTER — Encounter: Payer: Self-pay | Admitting: Internal Medicine

## 2022-03-30 VITALS — BP 124/70 | HR 89 | Ht 63.0 in | Wt 211.4 lb

## 2022-03-30 DIAGNOSIS — E282 Polycystic ovarian syndrome: Secondary | ICD-10-CM

## 2022-03-30 DIAGNOSIS — E781 Pure hyperglyceridemia: Secondary | ICD-10-CM

## 2022-03-30 DIAGNOSIS — R7303 Prediabetes: Secondary | ICD-10-CM | POA: Diagnosis not present

## 2022-03-30 DIAGNOSIS — E042 Nontoxic multinodular goiter: Secondary | ICD-10-CM

## 2022-03-30 LAB — POCT GLYCOSYLATED HEMOGLOBIN (HGB A1C): Hemoglobin A1C: 6 % — AB (ref 4.0–5.6)

## 2022-03-30 LAB — T4, FREE: Free T4: 0.75 ng/dL (ref 0.60–1.60)

## 2022-03-30 LAB — TSH: TSH: 3.1 u[IU]/mL (ref 0.35–5.50)

## 2022-03-30 MED ORDER — METFORMIN HCL ER 500 MG PO TB24: 1000.0000 mg | ORAL_TABLET | Freq: Every day | ORAL | 3 refills | Status: AC

## 2022-03-30 NOTE — Progress Notes (Signed)
Name: Gina Norman  MRN/ DOB: [id number], December 29, 1965    Age/ Sex: 56 y.o., female     PCP: Clovis Riley, L.August Saucer, MD   Reason for Endocrinology Evaluation: PCOS     Initial Endocrinology Clinic Visit: 09/18/2019    PATIENT IDENTIFIER: Gina Norman is a 56 y.o., female with a past medical history of PCOS, Dyslipidemia and obesity . She has followed with [facility] since 09/18/2019 for consultative assistance with management of her PCOS  HISTORICAL SUMMARY:   Gina Norman has been diagnosed with PCOS over 20 years ago, historically she has had irregular menstruations requiring OCPs.  She has spent most of her life on OCPs, until 2017 she got scared of them and had to stop them as her aunt was diagnosed with thromboembolic event.  she did have to see an infertility specialist while trying to conceive, requiring hysteroscopies.  She has 2 sons the youngest was born in 2010.  She had gestational diabetes during her pregnancies.   She has not had any menstruation since 2017    Patient has hypertriglyceridemia with TG levels  > 400 mg/dL in 01/5642.  She was started on ezetimibe at the time with repeat TG and 07/2019 at 350 mg/DL.    Patient denies hirsutism, she does have a few dark hairs on the chin that she is able to plug when needed.  Thyroid ultrasound 03/2020 showed MNG   SUBJECTIVE:    Today (03/30/2022):  Gina Norman is here for a follow up on PCOS, hypertriglyceridemia and Thyromegaly   She continues to follow up with the [facility] and was started on Mounjaro 2.5 mg in 03/2021 but insurance stopped covering it. She was prescribed  Wegovy but have not started as she is concerned about GI side effect which she experience with [facility]    Denies local neck swelling  Denies loose stools or diarrhea  Has rare palpitations a few days ago  Slight hand  twitching   Limited exercise due to back pain   Metformin 500 mg 2 tabs at night  Crestor 5 mg  three times a week  Zetia 10 mg  Daily  Fish oil 1650 mg BID      HISTORY:  Past Medical History:  Past Medical History:  Diagnosis Date   ADD (attention deficit disorder)    Back pain    Back pain    Complication of anesthesia    hard to wake up   DDD (degenerative disc disease), lumbosacral    Female infertility    High cholesterol    Hypertension    Insulin resistance    Joint pain    Lynch syndrome    PCOS (polycystic ovarian syndrome)    Pre-diabetes    Sleep apnea    Wears contact lenses    Past Surgical History:  Past Surgical History:  Procedure Laterality Date   CARPAL TUNNEL RELEASE Left 08/14/2014   Procedure: LEFT CARPAL TUNNEL RELEASE;  Surgeon: Cindee Salt, MD;  Location: [facility];  Service: Orthopedics;  Laterality: Left;  IV REGIONAL FAB   CARPAL TUNNEL RELEASE Right 09/23/2014   Procedure: RIGHT CARPAL TUNNEL RELEASE;  Surgeon: Cindee Salt, MD;  Location:  SURGERY CENTER;  Service: Orthopedics;  Laterality: Right;   ORIF ANKLE FRACTURE Left 04/12/2018   Procedure: OPEN REDUCTION INTERNAL FIXATION (ORIF) ANKLE FRACTURE;  Surgeon: Terance Hart, MD;  Location: [facility] OR;  Service: Orthopedics;  Laterality: Left;   TONSILLECTOMY  Social History:  reports that she quit smoking about 14 years ago. Her smoking use included cigarettes. She has a 5.00 pack-year smoking history. She has never used smokeless tobacco. She reports current alcohol use. She reports that she does not use drugs. Family History:  Family History  Problem Relation Age of Onset   Cancer Maternal Aunt        gallbladder cancer   Cancer Paternal Grandmother 46       breast cancer    Cancer Cousin        endometrial cancer   Cancer Cousin        breast cancer   Cancer Cousin        kidney cancer   Hypertension Mother    High Cholesterol Mother    Diabetes Father    High blood pressure Father    High Cholesterol Father    Sudden death Father      HOME  MEDICATIONS: Allergies as of 03/30/2022       Reactions   Erythromycin Nausea Only   Upset stomach         Medication List        Accurate as of March 30, 2022  7:37 AM. If you have any questions, ask your nurse or doctor.          STOP taking these medications    Naproxen Sodium 220 MG Caps Stopped by: Scarlette Shorts, MD       TAKE these medications    amphetamine-dextroamphetamine 15 MG tablet Commonly known as: Adderall Take 1 tablet by mouth 2 (two) times daily as needed.   cetirizine 10 MG tablet Commonly known as: ZYRTEC Take 10 mg by mouth daily.   ezetimibe 10 MG tablet Commonly known as: ZETIA Take 10 mg by mouth daily.   gabapentin 300 MG capsule Commonly known as: NEURONTIN Take 300-600 mg by mouth See admin instructions. 300 mg in the morning, 600 mg at bedtime   lisinopril-hydrochlorothiazide 10-12.5 MG tablet Commonly known as: ZESTORETIC Take 1 tablet by mouth daily.   metoprolol succinate 50 MG 24 hr tablet Commonly known as: TOPROL-XL Take 50 mg by mouth daily.   montelukast 10 MG tablet Commonly known as: SINGULAIR Take 10 mg by mouth at bedtime.   omega-3 acid ethyl esters 1 g capsule Commonly known as: LOVAZA Take by mouth 2 (two) times daily.   PROBIOTIC DAILY PO Take 1 capsule by mouth at bedtime.   Turmeric 450 MG Caps Take 1 capsule by mouth daily.   Vitamin D (Ergocalciferol) 1.25 MG (50000 UNIT) Caps capsule Commonly known as: DRISDOL Take 1 capsule (50,000 Units total) by mouth every 7 (seven) days.   Wegovy 0.5 MG/0.5ML Soaj Generic drug: Semaglutide-Weight Management Inject 0.5 mg into the skin once a week.          OBJECTIVE:   PHYSICAL EXAM: VS: BP 124/70 (BP Location: Left Arm, Patient Position: Sitting, Cuff Size: Large)   Pulse 89   Ht 5\' 3"  (1.6 m)   Wt 211 lb 6.4 oz (95.9 kg)   LMP 09/03/2014   SpO2 99%   BMI 37.45 kg/m    EXAM: General: Pt appears well and is in NAD  Neck:  General: Supple without adenopathy. Thyroid: Thyroid size normal.  Left thyroid fullness noted   Lungs: Clear with good BS bilat with no rales, rhonchi, or wheezes  Heart: Auscultation: RRR.  Abdomen: Normoactive bowel sounds, soft, nontender, without masses or organomegaly palpable  Extremities:  BL LE: No pretibial edema normal ROM and strength.  Mental Status: Judgment, insight: Intact Orientation: Oriented to time, place, and person Mood and affect: No depression, anxiety, or agitation     DATA REVIEWED:   Latest Reference Range & Units 03/30/22 07:40 03/30/22 07:52  Hemoglobin A1C 4.0 - 5.6 % 6.0 !   TSH 0.35 - 5.50 uIU/mL  3.10  T4,Free(Direct) 0.60 - 1.60 ng/dL  0.75      10/22/2021 BUN 19 Cr. 1.02 GFR 65 HDL 45 LDL 98 Tg 205   04/23/2021  TSH 2.18 FT4 0.91     Thyroid ULtrasound 04/19/2021   Estimated total number of nodules >/= 1 cm: 5   Number of spongiform nodules >/=  2 cm not described below (TR1): 0   Number of mixed cystic and solid nodules >/= 1.5 cm not described below (TR2): 0   _________________________________________________________   Nodule labeled 1 in the isthmus, 9 mm, remains cystic and does not meet criteria for surveillance or biopsy.   Nodule labeled 2 in the isthmus, appears less as a nodule on the current ultrasound and more like heterogeneous thyroid tissue measuring no greater than the prior. This is likely representative of a pseudo nodule.   Nodule labeled 3 superior right thyroid, which appears mostly cystic on the current ultrasound, TR 2. Given this the nodule no longer meets criteria for further surveillance, measuring 2.4 cm.   Nodule labeled 4 superior left thyroid, 2.3 cm, unchanged. Nodule remains TR 3 and meets criteria for continued surveillance.   Nodule which is measured in labeled 5, left thyroid, relatively unchanged in appearance and is favored to represent heterogeneous tissue, and less likely a  discrete nodule.   Nodule # 6:   Location: Left; Inferior   Maximum size: 1.3 cm; Other 2 dimensions: 1.3 cm x 0.9 cm   Composition: spongiform (0)   ACR TI-RADS recommendations:   Spongiform nodule does not meet criteria for surveillance or biopsy   _________________________________________________________   No adenopathy   Recommendations follow those established by the new ACR TI-RADS criteria (J Am Coll Radiol 4098;11:914-782).   IMPRESSION: Multinodular thyroid as above.   Left superior thyroid nodule (labeled 4, 2.3 cm, TR 3) meets criteria for surveillance, as designated by the newly established ACR TI-RADS criteria. Surveillance ultrasound study recommended to be performed annually up to 5 years.    ASSESSMENT / PLAN / RECOMMENDATIONS:   Polycystic Ovary Syndrome ( PCOS)    After menopause the two key diagnostic criteria of PCOS (irregular menses and polycystic ovaries on transvaginal ultrasound) are no longer applicable, because menopausal women have amenorrhea.  -The main issue with PCOS in the postmenopausal years, is hyperandrogenism , which she has no symptoms or sign of at this time. -Postmenopausal woman with PCOS are at a higher risk for developing type 2 diabetes, cardiovascular disease, and other metabolic disorders.  Our focus moving forward should be on her metabolic syndromes. -She is intolerant to [facility] due to GI side effects, she is reluctant to be on any more GLP-1 agonist, she would like to focus on diet  2.  Impaired fasting glucose:   -Patient is at high risk for developing type 2 diabetes given her hx of  impaired fasting glucose, and history of gestational diabetes in the past. - She has not been able to tolerate higher doses of metformin, currently on 2 tabs daily  - A1c 6.0%      Medications : Continue metformin 500 mg XR, 2  tabs daily   3.  Hypertriglyceridemia:   -Her triglyceride have been trending down, and they have been  stable around low 200s  Continue Rosuvastatin 10 mg three times a week  Continue Zetia 10 mg daily  Continue Fish oil BID    4. MNG:  -No local neck symptoms - Last year's ultrasound shows MNG with nodules meeting criteria for annual follow up  - Follow up ultrasound have been ordered  -TFTs are normal     F/U in 1 yr     Signed electronically by: Lyndle HerrlichAbby Jaralla Cambre Matson, MD  [facility]  [facility] [address] [place], [place] [address] Phone: [phone] FAX: [phone]      CC: Asencion GowdaMitchell, L.August Saucerean, MD [address] [place] [address] Phone: [phone]  Fax: [phone]   Return to Endocrinology clinic as below: Future Appointments  Date Time Provider Department Center  04/14/2022  7:40 AM Wilder GladeBeasley, Caren D, MD [facility] None

## 2022-04-05 ENCOUNTER — Other Ambulatory Visit: Payer: BC Managed Care – PPO

## 2022-04-14 ENCOUNTER — Encounter (INDEPENDENT_AMBULATORY_CARE_PROVIDER_SITE_OTHER): Payer: Self-pay | Admitting: Family Medicine

## 2022-04-14 ENCOUNTER — Ambulatory Visit (INDEPENDENT_AMBULATORY_CARE_PROVIDER_SITE_OTHER): Payer: BC Managed Care – PPO | Admitting: Family Medicine

## 2022-04-14 VITALS — BP 122/79 | HR 90 | Temp 97.7°F | Ht 63.0 in | Wt 212.0 lb

## 2022-04-14 DIAGNOSIS — Z6837 Body mass index (BMI) 37.0-37.9, adult: Secondary | ICD-10-CM

## 2022-04-14 DIAGNOSIS — E1169 Type 2 diabetes mellitus with other specified complication: Secondary | ICD-10-CM | POA: Diagnosis not present

## 2022-04-14 DIAGNOSIS — Z7985 Long-term (current) use of injectable non-insulin antidiabetic drugs: Secondary | ICD-10-CM

## 2022-04-14 DIAGNOSIS — E669 Obesity, unspecified: Secondary | ICD-10-CM | POA: Diagnosis not present

## 2022-04-14 DIAGNOSIS — E559 Vitamin D deficiency, unspecified: Secondary | ICD-10-CM

## 2022-04-14 MED ORDER — VITAMIN D (ERGOCALCIFEROL) 1.25 MG (50000 UNIT) PO CAPS: 50000.0000 [IU] | ORAL_CAPSULE | ORAL | 0 refills | Status: DC

## 2022-04-18 ENCOUNTER — Other Ambulatory Visit (HOSPITAL_BASED_OUTPATIENT_CLINIC_OR_DEPARTMENT_OTHER): Payer: Self-pay

## 2022-04-20 NOTE — Progress Notes (Signed)
Chief Complaint:   OBESITY Gina Norman is here to discuss her progress with her obesity treatment plan along with follow-up of her obesity related diagnoses. Gina Norman is on the Category 1 Plan and states she is following her eating plan approximately 75-80% of the time. Gina Norman states she is doing yoga and walking for 10 minutes 2-3 times per week.  Today's visit was #: 19 Starting weight: 213 lbs Starting date: 01/12/2021 Today's weight: 212 lbs Today's date: 04/14/2022 Total lbs lost to date: 1 Total lbs lost since last in-office visit: 0  Interim History: Gina Norman reports that she did not start Wegovy.  She reports having diarrhea and unable to start GLP-1.  She has been drinking some vinegar water lately and she reports decreased sweet cravings.  She is trying to get back on track, and trying to decrease simple carbohydrates and increase protein.  Subjective:   1. Type 2 diabetes mellitus with other specified complication, without long-term current use of insulin (HCC) Gina Norman's last A1c was 6.0.  She cannot tolerate GLP-1 due to GI upset.  She is stable on metformin.  2. Vitamin D insufficiency Gina Norman's last vitamin D level was 28.7, not yet at goal.  Assessment/Plan:   1. Type 2 diabetes mellitus with other specified complication, without long-term current use of insulin (HCC) Gina Norman will continue with her diet, exercise, and metformin and we will continue to follow.  2. Vitamin D insufficiency We will refill prescription Vitamin D for 1 month, and we will recheck labs at her next visit. Gina Norman will follow-up for routine testing of Vitamin D, at least 2-3 times per year to avoid over-replacement.  - Vitamin D, Ergocalciferol, (DRISDOL) 1.25 MG (50000 UNIT) CAPS capsule; Take 1 capsule (50,000 Units total) by mouth every 7 (seven) days.  Dispense: 4 capsule; Refill: 0  3. Obesity, Current BMI 37.6 Gina Norman is currently in the action stage of change. As such, her goal is to continue  with weight loss efforts. She has agreed to the Category 1 Plan.   We discussed working to meet protein goals first, and journaling using the app.  Exercise goals: As is.   Behavioral modification strategies: increasing lean protein intake, decreasing simple carbohydrates, meal planning and cooking strategies, keeping healthy foods in the home, and keeping a strict food journal.  Gina Norman has agreed to follow-up with our clinic in 3 to 4 weeks. She was informed of the importance of frequent follow-up visits to maximize her success with intensive lifestyle modifications for her multiple health conditions.   Objective:   Blood pressure 122/79, pulse 90, temperature 97.7 F (36.5 C), height 5\' 3"  (1.6 m), weight 212 lb (96.2 kg), last menstrual period 09/03/2014, SpO2 96 %. Body mass index is 37.55 kg/m.  General: Cooperative, alert, well developed, in no acute distress. HEENT: Conjunctivae and lids unremarkable. Cardiovascular: Regular rhythm.  Lungs: Normal work of breathing. Neurologic: No focal deficits.   Lab Results  Component Value Date   CREATININE 0.85 03/23/2021   BUN 13 03/23/2021   NA 138 03/23/2021   K 3.8 03/23/2021   CL 99 03/23/2021   CO2 22 03/23/2021   Lab Results  Component Value Date   ALT 40 (H) 03/23/2021   AST 39 03/23/2021   ALKPHOS 72 03/23/2021   BILITOT 0.7 03/23/2021   Lab Results  Component Value Date   HGBA1C 6.0 (A) 03/30/2022   HGBA1C 6.1 (H) 01/12/2021   HGBA1C 5.8 (H) 01/06/2020   Lab Results  Component Value  Date   INSULIN 48.3 (H) 03/23/2021   INSULIN 61.1 (H) 01/12/2021   INSULIN 32.7 (H) 01/06/2020   Lab Results  Component Value Date   TSH 3.10 03/30/2022   Lab Results  Component Value Date   CHOL 151 10/14/2020   HDL 40 10/14/2020   LDLCALC 86 10/14/2020   TRIG 144 10/14/2020   Lab Results  Component Value Date   VD25OH 28.7 (L) 03/23/2021   VD25OH 23.1 (L) 01/12/2021   VD25OH 27.3 (L) 01/06/2020   Lab Results   Component Value Date   WBC 6.7 04/12/2018   HGB 12.8 04/12/2018   HCT 40.3 04/12/2018   MCV 98.5 04/12/2018   PLT 165 04/12/2018   No results found for: "IRON", "TIBC", "FERRITIN"  Attestation Statements:   Reviewed by clinician on day of visit: allergies, medications, problem list, medical history, surgical history, family history, social history, and previous encounter notes.   I, Trixie Dredge, am acting as transcriptionist for Dennard Nip, MD.  I have reviewed the above documentation for accuracy and completeness, and I agree with the above. -  Dennard Nip, MD

## 2022-04-26 ENCOUNTER — Other Ambulatory Visit (HOSPITAL_BASED_OUTPATIENT_CLINIC_OR_DEPARTMENT_OTHER): Payer: Self-pay

## 2022-04-26 DIAGNOSIS — F900 Attention-deficit hyperactivity disorder, predominantly inattentive type: Secondary | ICD-10-CM | POA: Diagnosis not present

## 2022-04-26 DIAGNOSIS — M5459 Other low back pain: Secondary | ICD-10-CM | POA: Diagnosis not present

## 2022-04-26 DIAGNOSIS — Z23 Encounter for immunization: Secondary | ICD-10-CM | POA: Diagnosis not present

## 2022-04-26 DIAGNOSIS — E782 Mixed hyperlipidemia: Secondary | ICD-10-CM | POA: Diagnosis not present

## 2022-04-26 DIAGNOSIS — I10 Essential (primary) hypertension: Secondary | ICD-10-CM | POA: Diagnosis not present

## 2022-04-26 MED ORDER — CYCLOBENZAPRINE HCL 5 MG PO TABS
5.0000 mg | ORAL_TABLET | ORAL | 1 refills | Status: AC | PRN
  Filled 2022-04-26: qty 30, 30d supply, fill #0

## 2022-04-26 MED ORDER — AMPHETAMINE-DEXTROAMPHETAMINE 15 MG PO TABS
15.0000 mg | ORAL_TABLET | Freq: Two times a day (BID) | ORAL | 0 refills | Status: DC | PRN
  Filled 2022-04-26: qty 20, 10d supply, fill #0
  Filled 2022-04-26: qty 40, 20d supply, fill #0

## 2022-05-02 ENCOUNTER — Ambulatory Visit
Admission: RE | Admit: 2022-05-02 | Discharge: 2022-05-02 | Disposition: A | Discharge status: Home / Self Care | Payer: BC Managed Care – PPO | Source: Ambulatory Visit | Attending: Internal Medicine | Admitting: Internal Medicine

## 2022-05-02 DIAGNOSIS — E042 Nontoxic multinodular goiter: Secondary | ICD-10-CM

## 2022-05-12 ENCOUNTER — Encounter (INDEPENDENT_AMBULATORY_CARE_PROVIDER_SITE_OTHER): Payer: Self-pay | Admitting: Family Medicine

## 2022-05-12 ENCOUNTER — Ambulatory Visit (INDEPENDENT_AMBULATORY_CARE_PROVIDER_SITE_OTHER): Payer: BC Managed Care – PPO | Admitting: Family Medicine

## 2022-05-12 VITALS — BP 126/82 | HR 92 | Temp 98.1°F | Ht 63.0 in | Wt 210.0 lb

## 2022-05-12 DIAGNOSIS — E559 Vitamin D deficiency, unspecified: Secondary | ICD-10-CM

## 2022-05-12 DIAGNOSIS — F439 Reaction to severe stress, unspecified: Secondary | ICD-10-CM | POA: Diagnosis not present

## 2022-05-12 DIAGNOSIS — R7303 Prediabetes: Secondary | ICD-10-CM

## 2022-05-12 DIAGNOSIS — Z6837 Body mass index (BMI) 37.0-37.9, adult: Secondary | ICD-10-CM

## 2022-05-12 DIAGNOSIS — E669 Obesity, unspecified: Secondary | ICD-10-CM

## 2022-05-12 MED ORDER — VITAMIN D (ERGOCALCIFEROL) 1.25 MG (50000 UNIT) PO CAPS: 50000.0000 [IU] | ORAL_CAPSULE | ORAL | 0 refills | Status: DC

## 2022-05-23 NOTE — Progress Notes (Unsigned)
Chief Complaint:   OBESITY Gina Norman is here to discuss her progress with her obesity treatment plan along with follow-up of her obesity related diagnoses. Gina Norman is on the Category 1 Plan and states she is following her eating plan approximately 85% of the time. Gina Norman states she is doing 0 minutes 0 times per week.  Today's visit was #: 20 Starting weight: 213 lbs Starting date: 01/12/2021 Today's weight: 210 lbs Today's date: 05/12/2022 Total lbs lost to date: 3 Total lbs lost since last in-office visit: 2  Interim History: Swati has been very busy at work, and she is feeling stressed with work and family issues.  Her hunger is controlled, but she notes some cravings at bedtime.  She is working to get back on track.  Subjective:   1. Prediabetes Gina Norman notes some GI upset at times with metformin, so she takes it at night.  She is no longer taking GLP-1 due to GI upset.  Her last A1c was 6.0 and insulin level was 48.3.  2. Stress Gina Norman and I discussed medication options, but she would like to hold off for now.    3. Vitamin D insufficiency Gina Norman is taking vitamin D prescription with no side effects noted.  Her last vitamin D level was 28.7.  Assessment/Plan:   1. Prediabetes Gina Norman will continue metformin, and will continue with her eating plan and exercise.  2. Stress We discussed strategies to help manage stress and emotional eating.  We will continue to monitor and follow-up at [facility] next visit.  3. Vitamin D insufficiency Gina Norman will continue prescription Vitamin D 50,000 IU every week, and we will refill for 1 month. She will follow-up for routine testing of Vitamin D, at least 2-3 times per year to avoid over-replacement.  - Vitamin D, Ergocalciferol, (DRISDOL) 1.25 MG (50000 UNIT) CAPS capsule; Take 1 capsule (50,000 Units total) by mouth every 7 (seven) days.  Dispense: 4 capsule; Refill: 0  4. Obesity, Current BMI 37.2 Gina Norman is currently in the action stage  of change. As such, her goal is to continue with weight loss efforts. She has agreed to the Category 1 Plan.   Exercise goals: As is.   Behavioral modification strategies: increasing lean protein intake, decreasing simple carbohydrates, emotional eating strategies, and avoiding temptations.  Gina Norman has agreed to follow-up with our clinic in 3 to 4 weeks. She was informed of the importance of frequent follow-up visits to maximize her success with intensive lifestyle modifications for her multiple health conditions.   Objective:   Blood pressure 126/82, pulse 92, temperature 98.1 F (36.7 C), height 5\' 3"  (1.6 m), weight 210 lb (95.3 kg), last menstrual period 09/03/2014, SpO2 97 %. Body mass index is 37.2 kg/m.  General: Cooperative, alert, well developed, in no acute distress. HEENT: Conjunctivae and lids unremarkable. Cardiovascular: Regular rhythm.  Lungs: Normal work of breathing. Neurologic: No focal deficits.   Lab Results  Component Value Date   CREATININE 0.85 03/23/2021   BUN 13 03/23/2021   NA 138 03/23/2021   K 3.8 03/23/2021   CL 99 03/23/2021   CO2 22 03/23/2021   Lab Results  Component Value Date   ALT 40 (H) 03/23/2021   AST 39 03/23/2021   ALKPHOS 72 03/23/2021   BILITOT 0.7 03/23/2021   Lab Results  Component Value Date   HGBA1C 6.0 (A) 03/30/2022   HGBA1C 6.1 (H) 01/12/2021   HGBA1C 5.8 (H) 01/06/2020   Lab Results  Component Value Date  INSULIN 48.3 (H) 03/23/2021   INSULIN 61.1 (H) 01/12/2021   INSULIN 32.7 (H) 01/06/2020   Lab Results  Component Value Date   TSH 3.10 03/30/2022   Lab Results  Component Value Date   CHOL 151 10/14/2020   HDL 40 10/14/2020   LDLCALC 86 10/14/2020   TRIG 144 10/14/2020   Lab Results  Component Value Date   VD25OH 28.7 (L) 03/23/2021   VD25OH 23.1 (L) 01/12/2021   VD25OH 27.3 (L) 01/06/2020   Lab Results  Component Value Date   WBC 6.7 04/12/2018   HGB 12.8 04/12/2018   HCT 40.3 04/12/2018   MCV  98.5 04/12/2018   PLT 165 04/12/2018   No results found for: "IRON", "TIBC", "FERRITIN"  Attestation Statements:   Reviewed by clinician on day of visit: allergies, medications, problem list, medical history, surgical history, family history, social history, and previous encounter notes.   I, Burt Knack, am acting as transcriptionist for Quillian Quince, MD.  I have reviewed the above documentation for accuracy and completeness, and I agree with the above. -  Quillian Quince, MD

## 2022-05-27 ENCOUNTER — Other Ambulatory Visit (HOSPITAL_BASED_OUTPATIENT_CLINIC_OR_DEPARTMENT_OTHER): Payer: Self-pay

## 2022-05-27 MED ORDER — AMPHETAMINE-DEXTROAMPHETAMINE 15 MG PO TABS
15.0000 mg | ORAL_TABLET | Freq: Two times a day (BID) | ORAL | 0 refills | Status: DC | PRN
  Filled 2022-05-27: qty 60, 30d supply, fill #0

## 2022-06-16 ENCOUNTER — Ambulatory Visit (INDEPENDENT_AMBULATORY_CARE_PROVIDER_SITE_OTHER): Payer: BC Managed Care – PPO | Admitting: Family Medicine

## 2022-06-16 ENCOUNTER — Encounter (INDEPENDENT_AMBULATORY_CARE_PROVIDER_SITE_OTHER): Payer: Self-pay | Admitting: Family Medicine

## 2022-06-16 VITALS — BP 133/84 | HR 95 | Temp 98.2°F | Ht 63.0 in | Wt 211.0 lb

## 2022-06-16 DIAGNOSIS — Z6837 Body mass index (BMI) 37.0-37.9, adult: Secondary | ICD-10-CM | POA: Diagnosis not present

## 2022-06-16 DIAGNOSIS — E669 Obesity, unspecified: Secondary | ICD-10-CM

## 2022-06-16 DIAGNOSIS — E559 Vitamin D deficiency, unspecified: Secondary | ICD-10-CM

## 2022-06-16 DIAGNOSIS — F439 Reaction to severe stress, unspecified: Secondary | ICD-10-CM | POA: Diagnosis not present

## 2022-06-16 MED ORDER — VITAMIN D (ERGOCALCIFEROL) 1.25 MG (50000 UNIT) PO CAPS: 50000.0000 [IU] | ORAL_CAPSULE | ORAL | 0 refills | Status: DC

## 2022-06-16 MED ORDER — ESCITALOPRAM OXALATE 10 MG PO TABS: 10.0000 mg | ORAL_TABLET | Freq: Every day | ORAL | 0 refills | Status: DC

## 2022-06-27 NOTE — Progress Notes (Signed)
Chief Complaint:   OBESITY Gina Norman is here to discuss her progress with her obesity treatment plan along with follow-up of her obesity related diagnoses. Gina Norman is on the Category 1 Plan and states she is following her eating plan approximately 60% of the time. Gina Norman states she is doing 0 minutes 0 times per week.  Today's visit was #: 21 Starting weight: 213 lbs Starting date: 01/12/2021 Today's weight: 211 lbs Today's date: 06/16/2022 Total lbs lost to date: 2 Total lbs lost since last in-office visit: 0  Interim History: Gina Norman has done well with minimizing holiday weight gain over Thanksgiving. She  is dealing  with a lot of stress and she is not able to concentrate on weight loss right now. She would like to take a break from working on her diet and exercise for approximately 3 months until she is able to concentrate on her health more.   Subjective:   1. Stress [name] is under a lot of stress at work and at home, and she is feeling overwhelmed and she is doing  more comfort eating. She is open to looking at medications to help for a while.   2. Vitamin D insufficiency Gina Norman is on Vitamin D, and her last level was not yet at goal.   Assessment/Plan:   1. Stress Gina Norman agreed to start Lexapro 10 mg q AM with a 90 day supply, with no refill. Emotional eating behavior strategies were discussed.   - escitalopram (LEXAPRO) 10 MG tablet; Take 1 tablet (10 mg total) by mouth daily.  Dispense: 90 tablet; Refill: 0  2. Vitamin D insufficiency We will refill prescription Vitamin D for 90 days. Gina Norman will follow-up for routine testing of Vitamin D, at least 2-3 times per year to avoid over-replacement.  - Vitamin D, Ergocalciferol, (DRISDOL) 1.25 MG (50000 UNIT) CAPS capsule; Take 1 capsule (50,000 Units total) by mouth every 7 (seven) days.  Dispense: 12 capsule; Refill: 0  3. Obesity, Current BMI 37.5 Gina Norman is currently in the action stage of change. As such, her goal is to  continue with weight loss efforts. She has agreed to practicing portion control and making smarter food choices, such as increasing vegetables and decreasing simple carbohydrates.   Exercise goals: All adults should avoid inactivity. Some physical activity is better than none, and adults who participate in any amount of physical activity gain some health benefits.  Behavioral modification strategies: emotional eating strategies.  Gina Norman has agreed to follow-up with our clinic in 12 weeks. She was informed of the importance of frequent follow-up visits to maximize her success with intensive lifestyle modifications for her multiple health conditions.   Objective:   Blood pressure 133/84, pulse 95, temperature 98.2 F (36.8 C), height 5\' 3"  (1.6 m), weight 211 lb (95.7 kg), last menstrual period 09/03/2014, SpO2 98 %. Body mass index is 37.38 kg/m.  General: Cooperative, alert, well developed, in no acute distress. HEENT: Conjunctivae and lids unremarkable. Cardiovascular: Regular rhythm.  Lungs: Normal work of breathing. Neurologic: No focal deficits.   Lab Results  Component Value Date   CREATININE 0.85 03/23/2021   BUN 13 03/23/2021   NA 138 03/23/2021   K 3.8 03/23/2021   CL 99 03/23/2021   CO2 22 03/23/2021   Lab Results  Component Value Date   ALT 40 (H) 03/23/2021   AST 39 03/23/2021   ALKPHOS 72 03/23/2021   BILITOT 0.7 03/23/2021   Lab Results  Component Value Date   HGBA1C 6.0 (  A) 03/30/2022   HGBA1C 6.1 (H) 01/12/2021   HGBA1C 5.8 (H) 01/06/2020   Lab Results  Component Value Date   INSULIN 48.3 (H) 03/23/2021   INSULIN 61.1 (H) 01/12/2021   INSULIN 32.7 (H) 01/06/2020   Lab Results  Component Value Date   TSH 3.10 03/30/2022   Lab Results  Component Value Date   CHOL 151 10/14/2020   HDL 40 10/14/2020   LDLCALC 86 10/14/2020   TRIG 144 10/14/2020   Lab Results  Component Value Date   VD25OH 28.7 (L) 03/23/2021   VD25OH 23.1 (L) 01/12/2021    VD25OH 27.3 (L) 01/06/2020   Lab Results  Component Value Date   WBC 6.7 04/12/2018   HGB 12.8 04/12/2018   HCT 40.3 04/12/2018   MCV 98.5 04/12/2018   PLT 165 04/12/2018   No results found for: "IRON", "TIBC", "FERRITIN"  Attestation Statements:   Reviewed by clinician on day of visit: allergies, medications, problem list, medical history, surgical history, family history, social history, and previous encounter notes.   I, Burt Knack, am acting as transcriptionist for Quillian Quince, MD.  I have reviewed the above documentation for accuracy and completeness, and I agree with the above. -  Quillian Quince, MD

## 2022-07-11 DIAGNOSIS — R748 Abnormal levels of other serum enzymes: Secondary | ICD-10-CM

## 2022-07-11 HISTORY — DX: Abnormal levels of other serum enzymes: R74.8

## 2022-07-26 ENCOUNTER — Other Ambulatory Visit (HOSPITAL_BASED_OUTPATIENT_CLINIC_OR_DEPARTMENT_OTHER): Payer: Self-pay

## 2022-07-26 MED ORDER — AMPHETAMINE-DEXTROAMPHETAMINE 15 MG PO TABS
15.0000 mg | ORAL_TABLET | Freq: Two times a day (BID) | ORAL | 0 refills | Status: DC | PRN
  Filled 2022-07-26: qty 60, 30d supply, fill #0

## 2022-07-27 ENCOUNTER — Other Ambulatory Visit (HOSPITAL_BASED_OUTPATIENT_CLINIC_OR_DEPARTMENT_OTHER): Payer: Self-pay

## 2022-09-05 DIAGNOSIS — H6501 Acute serous otitis media, right ear: Secondary | ICD-10-CM | POA: Diagnosis not present

## 2022-09-08 ENCOUNTER — Ambulatory Visit (INDEPENDENT_AMBULATORY_CARE_PROVIDER_SITE_OTHER): Payer: BC Managed Care – PPO | Admitting: Family Medicine

## 2022-09-16 DIAGNOSIS — H18601 Keratoconus, unspecified, right eye: Secondary | ICD-10-CM | POA: Diagnosis not present

## 2022-09-16 DIAGNOSIS — H5213 Myopia, bilateral: Secondary | ICD-10-CM | POA: Diagnosis not present

## 2022-09-21 ENCOUNTER — Other Ambulatory Visit (HOSPITAL_BASED_OUTPATIENT_CLINIC_OR_DEPARTMENT_OTHER): Payer: Self-pay

## 2022-09-21 MED ORDER — AMPHETAMINE-DEXTROAMPHETAMINE 15 MG PO TABS
15.0000 mg | ORAL_TABLET | Freq: Two times a day (BID) | ORAL | 0 refills | Status: DC | PRN
  Filled 2022-09-21: qty 60, 30d supply, fill #0

## 2022-10-18 DIAGNOSIS — G4733 Obstructive sleep apnea (adult) (pediatric): Secondary | ICD-10-CM | POA: Diagnosis not present

## 2022-10-26 ENCOUNTER — Encounter (HOSPITAL_BASED_OUTPATIENT_CLINIC_OR_DEPARTMENT_OTHER): Payer: Self-pay | Admitting: Pharmacist

## 2022-10-26 ENCOUNTER — Other Ambulatory Visit (HOSPITAL_BASED_OUTPATIENT_CLINIC_OR_DEPARTMENT_OTHER): Payer: Self-pay

## 2022-10-26 DIAGNOSIS — Z0000 Encounter for general adult medical examination without abnormal findings: Secondary | ICD-10-CM | POA: Diagnosis not present

## 2022-10-26 DIAGNOSIS — Z23 Encounter for immunization: Secondary | ICD-10-CM | POA: Diagnosis not present

## 2022-10-26 DIAGNOSIS — E782 Mixed hyperlipidemia: Secondary | ICD-10-CM | POA: Diagnosis not present

## 2022-10-26 DIAGNOSIS — I10 Essential (primary) hypertension: Secondary | ICD-10-CM | POA: Diagnosis not present

## 2022-10-26 MED ORDER — AMPHETAMINE SULFATE 10 MG PO TABS
10.0000 mg | ORAL_TABLET | Freq: Two times a day (BID) | ORAL | 0 refills | Status: DC
  Filled 2022-10-26: qty 60, 30d supply, fill #0
  Filled 2022-11-10: qty 60, 60d supply, fill #0
  Filled 2022-11-10 – 2022-11-22 (×3): qty 60, 30d supply, fill #0

## 2022-10-28 ENCOUNTER — Other Ambulatory Visit (HOSPITAL_BASED_OUTPATIENT_CLINIC_OR_DEPARTMENT_OTHER): Payer: Self-pay

## 2022-10-30 ENCOUNTER — Other Ambulatory Visit (HOSPITAL_BASED_OUTPATIENT_CLINIC_OR_DEPARTMENT_OTHER): Payer: Self-pay

## 2022-11-02 ENCOUNTER — Other Ambulatory Visit (HOSPITAL_BASED_OUTPATIENT_CLINIC_OR_DEPARTMENT_OTHER): Payer: Self-pay

## 2022-11-04 ENCOUNTER — Other Ambulatory Visit: Payer: Self-pay

## 2022-11-07 ENCOUNTER — Other Ambulatory Visit (HOSPITAL_BASED_OUTPATIENT_CLINIC_OR_DEPARTMENT_OTHER): Payer: Self-pay

## 2022-11-09 ENCOUNTER — Other Ambulatory Visit: Payer: Self-pay

## 2022-11-10 ENCOUNTER — Other Ambulatory Visit (HOSPITAL_BASED_OUTPATIENT_CLINIC_OR_DEPARTMENT_OTHER): Payer: Self-pay

## 2022-11-11 ENCOUNTER — Other Ambulatory Visit (HOSPITAL_BASED_OUTPATIENT_CLINIC_OR_DEPARTMENT_OTHER): Payer: Self-pay

## 2022-11-11 ENCOUNTER — Encounter (HOSPITAL_BASED_OUTPATIENT_CLINIC_OR_DEPARTMENT_OTHER): Payer: Self-pay

## 2022-11-14 ENCOUNTER — Other Ambulatory Visit (HOSPITAL_BASED_OUTPATIENT_CLINIC_OR_DEPARTMENT_OTHER): Payer: Self-pay

## 2022-11-16 ENCOUNTER — Other Ambulatory Visit (HOSPITAL_BASED_OUTPATIENT_CLINIC_OR_DEPARTMENT_OTHER): Payer: Self-pay

## 2022-11-21 ENCOUNTER — Other Ambulatory Visit (HOSPITAL_BASED_OUTPATIENT_CLINIC_OR_DEPARTMENT_OTHER): Payer: Self-pay

## 2022-11-22 ENCOUNTER — Other Ambulatory Visit (HOSPITAL_BASED_OUTPATIENT_CLINIC_OR_DEPARTMENT_OTHER): Payer: Self-pay

## 2022-12-08 DIAGNOSIS — Z01419 Encounter for gynecological examination (general) (routine) without abnormal findings: Secondary | ICD-10-CM | POA: Diagnosis not present

## 2022-12-08 DIAGNOSIS — Z124 Encounter for screening for malignant neoplasm of cervix: Secondary | ICD-10-CM | POA: Diagnosis not present

## 2022-12-08 DIAGNOSIS — Z1231 Encounter for screening mammogram for malignant neoplasm of breast: Secondary | ICD-10-CM | POA: Diagnosis not present

## 2022-12-08 DIAGNOSIS — Z6836 Body mass index (BMI) 36.0-36.9, adult: Secondary | ICD-10-CM | POA: Diagnosis not present

## 2022-12-09 DIAGNOSIS — M25572 Pain in left ankle and joints of left foot: Secondary | ICD-10-CM | POA: Diagnosis not present

## 2022-12-23 ENCOUNTER — Other Ambulatory Visit (HOSPITAL_BASED_OUTPATIENT_CLINIC_OR_DEPARTMENT_OTHER): Payer: Self-pay

## 2022-12-23 MED ORDER — AMPHETAMINE SULFATE 10 MG PO TABS
10.0000 mg | ORAL_TABLET | Freq: Two times a day (BID) | ORAL | 0 refills | Status: DC
  Filled 2022-12-23: qty 60, 30d supply, fill #0

## 2022-12-25 ENCOUNTER — Other Ambulatory Visit (HOSPITAL_BASED_OUTPATIENT_CLINIC_OR_DEPARTMENT_OTHER): Payer: Self-pay

## 2022-12-27 ENCOUNTER — Other Ambulatory Visit: Payer: Self-pay | Admitting: Family Medicine

## 2022-12-27 ENCOUNTER — Other Ambulatory Visit: Payer: Self-pay | Admitting: Physician Assistant

## 2022-12-27 DIAGNOSIS — M25572 Pain in left ankle and joints of left foot: Secondary | ICD-10-CM | POA: Diagnosis not present

## 2022-12-27 DIAGNOSIS — R748 Abnormal levels of other serum enzymes: Secondary | ICD-10-CM

## 2023-01-03 ENCOUNTER — Ambulatory Visit
Admission: RE | Admit: 2023-01-03 | Discharge: 2023-01-03 | Disposition: A | Discharge status: Home / Self Care | Payer: BC Managed Care – PPO | Source: Ambulatory Visit | Attending: Family Medicine | Admitting: Family Medicine

## 2023-01-03 DIAGNOSIS — R748 Abnormal levels of other serum enzymes: Secondary | ICD-10-CM

## 2023-01-03 DIAGNOSIS — R7989 Other specified abnormal findings of blood chemistry: Secondary | ICD-10-CM | POA: Diagnosis not present

## 2023-01-06 DIAGNOSIS — S82842D Displaced bimalleolar fracture of left lower leg, subsequent encounter for closed fracture with routine healing: Secondary | ICD-10-CM | POA: Diagnosis not present

## 2023-01-06 DIAGNOSIS — Z1509 Genetic susceptibility to other malignant neoplasm: Secondary | ICD-10-CM | POA: Diagnosis not present

## 2023-01-06 DIAGNOSIS — Z8481 Family history of carrier of genetic disease: Secondary | ICD-10-CM | POA: Diagnosis not present

## 2023-01-20 ENCOUNTER — Other Ambulatory Visit (HOSPITAL_BASED_OUTPATIENT_CLINIC_OR_DEPARTMENT_OTHER): Payer: Self-pay

## 2023-01-20 MED ORDER — AMPHETAMINE SULFATE 10 MG PO TABS
10.0000 mg | ORAL_TABLET | Freq: Two times a day (BID) | ORAL | 0 refills | Status: DC
  Filled 2023-01-20: qty 60, 30d supply, fill #0

## 2023-01-23 ENCOUNTER — Other Ambulatory Visit: Payer: Self-pay

## 2023-01-30 ENCOUNTER — Other Ambulatory Visit (HOSPITAL_BASED_OUTPATIENT_CLINIC_OR_DEPARTMENT_OTHER): Payer: Self-pay

## 2023-01-30 ENCOUNTER — Other Ambulatory Visit (INDEPENDENT_AMBULATORY_CARE_PROVIDER_SITE_OTHER): Payer: Self-pay | Admitting: Family Medicine

## 2023-01-30 DIAGNOSIS — E559 Vitamin D deficiency, unspecified: Secondary | ICD-10-CM

## 2023-02-06 DIAGNOSIS — Z8481 Family history of carrier of genetic disease: Secondary | ICD-10-CM | POA: Diagnosis not present

## 2023-02-06 DIAGNOSIS — K5140 Inflammatory polyps of colon without complications: Secondary | ICD-10-CM | POA: Diagnosis not present

## 2023-02-06 DIAGNOSIS — Z1211 Encounter for screening for malignant neoplasm of colon: Secondary | ICD-10-CM | POA: Diagnosis not present

## 2023-02-06 DIAGNOSIS — K5730 Diverticulosis of large intestine without perforation or abscess without bleeding: Secondary | ICD-10-CM | POA: Diagnosis not present

## 2023-02-10 HISTORY — PX: COLONOSCOPY: SHX174

## 2023-02-15 DIAGNOSIS — Z803 Family history of malignant neoplasm of breast: Secondary | ICD-10-CM | POA: Diagnosis not present

## 2023-02-15 DIAGNOSIS — D250 Submucous leiomyoma of uterus: Secondary | ICD-10-CM | POA: Diagnosis not present

## 2023-02-15 DIAGNOSIS — Z789 Other specified health status: Secondary | ICD-10-CM | POA: Diagnosis not present

## 2023-02-15 DIAGNOSIS — D259 Leiomyoma of uterus, unspecified: Secondary | ICD-10-CM | POA: Diagnosis not present

## 2023-02-15 DIAGNOSIS — Z1509 Genetic susceptibility to other malignant neoplasm: Secondary | ICD-10-CM | POA: Diagnosis not present

## 2023-02-27 ENCOUNTER — Other Ambulatory Visit: Payer: Self-pay

## 2023-02-27 ENCOUNTER — Encounter (HOSPITAL_BASED_OUTPATIENT_CLINIC_OR_DEPARTMENT_OTHER): Payer: Self-pay | Admitting: Obstetrics and Gynecology

## 2023-02-27 NOTE — Progress Notes (Signed)
Your procedure is scheduled on Friday, 03/17/23.  Report to [facility] AT  7:00 AM.   Call this number if you have problems the morning of surgery  :[phone].   OUR ADDRESS IS [address].  WE ARE LOCATED IN [facility].  PLEASE BRING YOUR INSURANCE CARD AND PHOTO ID DAY OF SURGERY.  ONLY 2 PEOPLE ARE ALLOWED IN  WAITING  ROOM                                      REMEMBER:  DO NOT EAT FOOD, CANDY GUM OR MINTS  AFTER MIDNIGHT THE NIGHT BEFORE YOUR SURGERY . YOU MAY HAVE CLEAR LIQUIDS FROM MIDNIGHT THE NIGHT BEFORE YOUR SURGERY UNTIL  6:00 AM. NO CLEAR LIQUIDS AFTER   6:00 AM DAY OF SURGERY.  YOU MAY  BRUSH YOUR TEETH MORNING OF SURGERY AND RINSE YOUR MOUTH OUT, NO CHEWING GUM CANDY OR MINTS.     CLEAR LIQUID DIET    Allowed      Water                                                                   Coffee and tea, regular and decaf  (NO cream or milk products of any type, may sweeten)                         Carbonated beverages, regular and diet                                    Sports drinks like Gatorade _____________________________________________________________________     TAKE ONLY THESE MEDICATIONS MORNING OF SURGERY: Omeprazole, Flexeril if needed, Zetia, Gabapentin, Metoprolol, Crestor, Nasocort if needed DO NOT TAKE EVEKEO ON THE MORNING OF SURGERY. DO NOT TAKE METFORMIN THE MORNING OF SURGERY.                                        DO NOT WEAR JEWERLY/  METAL/  PIERCINGS (INCLUDING NO PLASTIC PIERCINGS) DO NOT WEAR LOTIONS, POWDERS, PERFUMES OR NAIL POLISH ON YOUR FINGERNAILS. TOENAIL POLISH IS OK TO WEAR. DO NOT SHAVE FOR 48 HOURS PRIOR TO DAY OF SURGERY.  CONTACTS, GLASSES, OR DENTURES MAY NOT BE WORN TO SURGERY.  REMEMBER: NO SMOKING, VAPING ,  DRUGS OR ALCOHOL FOR 24 HOURS BEFORE YOUR SURGERY.                                    Gina Norman IS NOT RESPONSIBLE  FOR ANY BELONGINGS.                                                                     [name] Kitchen  Gina Norman - Preparing for Surgery Before surgery, you can play an important role.  Because skin is not sterile, your skin needs to be as free of germs as possible.  You can reduce the number of germs on your skin by washing with CHG (chlorahexidine gluconate) soap before surgery.  CHG is an antiseptic cleaner which kills germs and bonds with the skin to continue killing germs even after washing. Please DO NOT use if you have an allergy to CHG or antibacterial soaps.  If your skin becomes reddened/irritated stop using the CHG and inform your nurse when you arrive at Short Stay. Do not shave (including legs and underarms) for at least 48 hours prior to the first CHG shower.  You may shave your face/neck. Please follow these instructions carefully:  1.  Shower with CHG Soap the night before surgery and the  morning of Surgery.  2.  If you choose to wash your hair, wash your hair first as usual with your  normal  shampoo.  3.  After you shampoo, rinse your hair and body thoroughly to remove the  shampoo.                                        4.  Use CHG as you would any other liquid soap.  You can apply chg directly  to the skin and wash , chg soap provided, night before and morning of your surgery.  5.  Apply the CHG Soap to your body ONLY FROM THE NECK DOWN.   Do not use on face/ open                           Wound or open sores. Avoid contact with eyes, ears mouth and genitals (private parts).                       Wash face,  Genitals (private parts) with your normal soap.             6.  Wash thoroughly, paying special attention to the area where your surgery  will be performed.  7.  Thoroughly rinse your body with warm water from the neck down.  8.  DO NOT shower/wash with your normal soap after using and rinsing off  the CHG Soap.             9.  Pat yourself dry with a clean towel.            10.  Wear clean pajamas.            11.   Place clean sheets on your bed the night of your first shower and do not  sleep with pets. Day of Surgery : Do not apply any lotions/ powders the morning of surgery.  Please wear clean clothes to the hospital/surgery center.  IF YOU HAVE ANY SKIN IRRITATION OR PROBLEMS WITH THE SURGICAL SOAP, PLEASE GET A BAR OF GOLD DIAL SOAP AND SHOWER THE NIGHT BEFORE YOUR SURGERY AND THE MORNING OF YOUR SURGERY. PLEASE LET THE NURSE KNOW MORNING OF YOUR SURGERY IF YOU HAD ANY PROBLEMS WITH THE SURGICAL SOAP.   YOUR SURGEON MAY HAVE REQUESTED EXTENDED RECOVERY TIME AFTER YOUR SURGERY. IT COULD BE A  JUST A FEW HOURS  UP TO AN OVERNIGHT STAY.  YOUR SURGEON SHOULD HAVE DISCUSSED  THIS WITH YOU PRIOR TO YOUR SURGERY. IN THE EVENT YOU NEED TO STAY OVERNIGHT PLEASE REFER TO THE FOLLOWING GUIDELINES. YOU MAY HAVE UP TO 4 VISITORS  MAY VISIT IN THE EXTENDED RECOVERY ROOM UNTIL 800 PM ONLY.  ONE  VISITOR AGE 13 AND OVER MAY SPEND THE NIGHT AND MUST BE IN EXTENDED RECOVERY ROOM NO LATER THAN 800 PM . YOUR DISCHARGE TIME AFTER YOU SPEND THE NIGHT IS 900 AM THE MORNING AFTER YOUR SURGERY. YOU MAY PACK A SMALL OVERNIGHT BAG WITH TOILETRIES FOR YOUR OVERNIGHT STAY IF YOU WISH.  REGARDLESS OF IF YOU STAY OVER NIGHT OR ARE DISCHARGED THE SAME DAY YOU WILL BE REQUIRED TO HAVE A RESPONSIBLE ADULT (18 YRS OLD OR OLDER) STAY WITH YOU FOR AT LEAST THE FIRST 24 HOURS  YOUR PRESCRIPTION MEDICATIONS WILL BE PROVIDED DURING YOUR HOSPITAL STAY.  ________________________________________________________________________                                                        QUESTIONS Gina Norman PRE OP NURSE PHONE [phone].

## 2023-02-27 NOTE — Progress Notes (Signed)
Spoke w/ via phone for pre-op interview---[facility] needs dos---- none              Lab results------03/10/23 lab appt for cbc, cmp, type & screen, ekg COVID test -----patient states asymptomatic no test needed Arrive at -------0700 on Friday, 03/17/23 NPO after MN NO Solid Food.  Clear liquids from MN until---0600 Med rec completed Medications to take morning of surgery -----Omeprazole, Flexeril prn, Zetia, Gabapentin, Metoprolol, Crestor, Nasocort prn, Do NOT take Evekeo on the morning of surgery. Diabetic medication -----Hold Metformin on the morning of surgery. Patient instructed no nail polish to be worn day of surgery Patient instructed to bring photo id and insurance card day of surgery Patient aware to have Driver (ride ) / caregiver    for 24 hours after surgery - husband, Cristal Deer Patient Special Instructions -----Extended / overnight stay instructions given. Pre-Op special Instructions -----Patient is aware that contacts must be removed prior to OR. Patient verbalized understanding of instructions that were given at this phone interview. Patient denies shortness of breath, chest pain, fever, cough at this phone interview.

## 2023-03-03 ENCOUNTER — Other Ambulatory Visit (HOSPITAL_BASED_OUTPATIENT_CLINIC_OR_DEPARTMENT_OTHER): Payer: Self-pay

## 2023-03-03 MED ORDER — AMPHETAMINE SULFATE 10 MG PO TABS
10.0000 mg | ORAL_TABLET | Freq: Two times a day (BID) | ORAL | 0 refills | Status: DC
  Filled 2023-03-03: qty 60, 30d supply, fill #0

## 2023-03-06 ENCOUNTER — Other Ambulatory Visit (HOSPITAL_BASED_OUTPATIENT_CLINIC_OR_DEPARTMENT_OTHER): Payer: Self-pay

## 2023-03-10 ENCOUNTER — Other Ambulatory Visit: Payer: Self-pay

## 2023-03-10 ENCOUNTER — Encounter (HOSPITAL_COMMUNITY)
Admission: RE | Admit: 2023-03-10 | Discharge: 2023-03-10 | Disposition: A | Discharge status: Home / Self Care | Payer: BC Managed Care – PPO | Source: Ambulatory Visit | Attending: Obstetrics and Gynecology | Admitting: Obstetrics and Gynecology

## 2023-03-10 DIAGNOSIS — Z01818 Encounter for other preprocedural examination: Secondary | ICD-10-CM | POA: Diagnosis not present

## 2023-03-10 LAB — CBC
HCT: 42.2 % (ref 36.0–46.0)
Hemoglobin: 14.2 g/dL (ref 12.0–15.0)
MCH: 31.8 pg (ref 26.0–34.0)
MCHC: 33.6 g/dL (ref 30.0–36.0)
MCV: 94.6 fL (ref 80.0–100.0)
Platelets: 161 10*3/uL (ref 150–400)
RBC: 4.46 MIL/uL (ref 3.87–5.11)
RDW: 11.9 % (ref 11.5–15.5)
WBC: 7.5 10*3/uL (ref 4.0–10.5)
nRBC: 0 % (ref 0.0–0.2)

## 2023-03-16 NOTE — H&P (Signed)
Gina Norman is an 57 y.o. female P2 with Lynch syndrome, desires risk reducing hysterectomy and bilateral salpingo-oophorectomy.   02/15/23 [place] 5.8cm anteverted uterus, EMS 3mm, normal ovaries. 02/15/23 [facility] STRIPS OF ATROPHIC ENDOMETRIAL LINING EPITHELIUM NO EVIDENCE OF UNOPPOSED ESTROGEN EFFECT (HYPERPLASIA) OR MALIGNANCY  Pertinent Gynecological History: Menses: post-menopausal Bleeding: none Sexually transmitted diseases: no past history Previous GYN Procedures:  LEEP   Last mammogram: normal Date: 12/08/22 Last pap: normal Date: 12/08/22 OB History: G4, P2022   Menstrual History: Patient's last menstrual period was 09/03/2014.    Past Medical History:  Diagnosis Date   ADD (attention deficit disorder)    Back pain    lower back pain   Complication of anesthesia    hard to wake up   DDD (degenerative disc disease), lumbosacral    Elevated liver enzymes 2024   Female infertility    GERD (gastroesophageal reflux disease)    takes omeprazole prn   High cholesterol    Follows w/ PCP at [facility].   Hypertension    Follows with PCP at [facility].   Insulin resistance    Follows w/ [place] [facility] end[facility]ology.   Joint pain    Lynch syndrome    PCOS (polycystic ovarian syndrome)    Follows with endocrinologist at [facility].   Pinched nerve    in low back   Pre-diabetes    Follows w/ PCP @ [facility].   Sleep apnea    uses mouth guard   Wears contact lenses     Past Surgical History:  Procedure Laterality Date   CARPAL TUNNEL RELEASE Left 08/14/2014   Procedure: LEFT CARPAL TUNNEL RELEASE;  Surgeon: Cindee Salt, MD;  Location: [facility];  Service: Orthopedics;  Laterality: Left;  IV REGIONAL FAB   CARPAL TUNNEL RELEASE Right 09/23/2014   Procedure: RIGHT CARPAL TUNNEL RELEASE;  Surgeon: Cindee Salt, MD;  Location: [facility];  Service: Orthopedics;  Laterality: Right;   COLONOSCOPY  02/10/2023   inflamatory polyp   ORIF ANKLE  FRACTURE Left 04/12/2018   Procedure: OPEN REDUCTION INTERNAL FIXATION (ORIF) ANKLE FRACTURE;  Surgeon: Terance Hart, MD;  Location: [facility] OR;  Service: Orthopedics;  Laterality: Left;   TONSILLECTOMY      Family History  Problem Relation Age of Onset   Cancer Maternal Aunt        gallbladder cancer   Cancer Paternal Grandmother 32       breast cancer    Cancer Cousin        endometrial cancer   Cancer Cousin        breast cancer   Cancer Cousin        kidney cancer   Hypertension Mother    High Cholesterol Mother    Diabetes Father    High blood pressure Father    High Cholesterol Father    Sudden death Father     Social History:  reports that she quit smoking about 15 years ago. Her smoking use included cigarettes. She started smoking about 25 years ago. She has a 5 pack-year smoking history. She has never used smokeless tobacco. She reports current alcohol use of about 2.0 standard drinks of alcohol per week. She reports that she does not use drugs.  Allergies:  Allergies  Allergen Reactions   Erythromycin Nausea Only    Upset stomach    Gold-Containing Drug Products     Patch test abnormal at allergy specialist.   Welchol [Colesevelam] Rash  No medications prior to admission.    Review of Systems  Constitutional:  Negative for fever.  HENT:  Negative for sore throat.   Eyes:  Negative for visual disturbance.  Respiratory:  Negative for shortness of breath.   Cardiovascular:  Negative for chest pain.  Gastrointestinal:  Negative for abdominal pain.  Genitourinary:  Negative for vaginal bleeding.  Musculoskeletal:  Negative for neck pain.  Skin:  Negative for rash.  Neurological:  Negative for headaches.  Psychiatric/Behavioral:  Negative for suicidal ideas.     Height 5' 3.5" (1.613 m), weight 93.9 kg, last menstrual period 09/03/2014. Physical Exam Constitutional General Appearance: obese  Head Head: normocephalic  Lungs Respiratory Effort: no  accessory muscle usage, no intercostal retractions  Extremities Legs: normal Arms: normal  Skin Appearance: no obvious rashes, no obvious lesions  Neurological System Impressions motor: no deficits, sensory: no deficits  Psychiatric Orientation: to person, to time Mood and Affect: active and alert, normal mood, normal affect  Pelvic: small anteverted uterus, no adnexal masses or tenderness Speculum exam; normal vagina and cervix, no lesions or abnormal discharge No results found for this or any previous visit (from the past 24 hour(s)).  No results found.  Assessment/Plan: 57Y P2 with Lynch syndrome for risk reducing surgery Plan: robot assisted total laparoscopic hysterectomy, bilateral salpingo-oophorectomy, cystoscopy Procedure reviewed in detail. Reviewed risks including pain, bleeding requiring blood transfusion, infection requiring antibiotics, injury to nearby organs (bowel, bladder, nerves, blood vessels, ureter), need for laparotomy to complete the operation, or failure to achieve desired results. All questions answered.  Ancef 2g Avoid tylenol due to elevated LFTs  Charlett Nose 03/16/2023, 3:22 PM

## 2023-03-17 ENCOUNTER — Other Ambulatory Visit: Payer: Self-pay

## 2023-03-17 ENCOUNTER — Ambulatory Visit (HOSPITAL_BASED_OUTPATIENT_CLINIC_OR_DEPARTMENT_OTHER): Payer: BC Managed Care – PPO | Admitting: Anesthesiology

## 2023-03-17 ENCOUNTER — Ambulatory Visit (HOSPITAL_BASED_OUTPATIENT_CLINIC_OR_DEPARTMENT_OTHER)
Admission: RE | Admit: 2023-03-17 | Discharge: 2023-03-18 | Disposition: A | Discharge status: Home / Self Care | Payer: BC Managed Care – PPO | Attending: Obstetrics and Gynecology | Admitting: Obstetrics and Gynecology

## 2023-03-17 ENCOUNTER — Encounter (HOSPITAL_BASED_OUTPATIENT_CLINIC_OR_DEPARTMENT_OTHER)
Admission: RE | Disposition: A | Discharge status: Home / Self Care | Payer: Self-pay | Source: Home / Self Care | Attending: Obstetrics and Gynecology

## 2023-03-17 ENCOUNTER — Encounter (HOSPITAL_BASED_OUTPATIENT_CLINIC_OR_DEPARTMENT_OTHER): Payer: Self-pay | Admitting: Obstetrics and Gynecology

## 2023-03-17 DIAGNOSIS — Z302 Encounter for sterilization: Secondary | ICD-10-CM | POA: Diagnosis not present

## 2023-03-17 DIAGNOSIS — D259 Leiomyoma of uterus, unspecified: Secondary | ICD-10-CM | POA: Insufficient documentation

## 2023-03-17 DIAGNOSIS — Z1509 Genetic susceptibility to other malignant neoplasm: Secondary | ICD-10-CM | POA: Diagnosis not present

## 2023-03-17 DIAGNOSIS — I10 Essential (primary) hypertension: Secondary | ICD-10-CM | POA: Diagnosis not present

## 2023-03-17 DIAGNOSIS — E282 Polycystic ovarian syndrome: Secondary | ICD-10-CM | POA: Insufficient documentation

## 2023-03-17 DIAGNOSIS — Z4009 Encounter for prophylactic removal of other organ: Secondary | ICD-10-CM | POA: Insufficient documentation

## 2023-03-17 DIAGNOSIS — Z6836 Body mass index (BMI) 36.0-36.9, adult: Secondary | ICD-10-CM | POA: Diagnosis not present

## 2023-03-17 DIAGNOSIS — Z803 Family history of malignant neoplasm of breast: Secondary | ICD-10-CM | POA: Insufficient documentation

## 2023-03-17 DIAGNOSIS — Z01818 Encounter for other preprocedural examination: Secondary | ICD-10-CM

## 2023-03-17 DIAGNOSIS — E669 Obesity, unspecified: Secondary | ICD-10-CM | POA: Insufficient documentation

## 2023-03-17 DIAGNOSIS — E119 Type 2 diabetes mellitus without complications: Secondary | ICD-10-CM | POA: Insufficient documentation

## 2023-03-17 DIAGNOSIS — Z87891 Personal history of nicotine dependence: Secondary | ICD-10-CM | POA: Diagnosis not present

## 2023-03-17 DIAGNOSIS — G4730 Sleep apnea, unspecified: Secondary | ICD-10-CM | POA: Diagnosis not present

## 2023-03-17 DIAGNOSIS — Z7984 Long term (current) use of oral hypoglycemic drugs: Secondary | ICD-10-CM | POA: Insufficient documentation

## 2023-03-17 DIAGNOSIS — D251 Intramural leiomyoma of uterus: Secondary | ICD-10-CM | POA: Diagnosis not present

## 2023-03-17 DIAGNOSIS — Z90710 Acquired absence of both cervix and uterus: Secondary | ICD-10-CM | POA: Diagnosis present

## 2023-03-17 DIAGNOSIS — K219 Gastro-esophageal reflux disease without esophagitis: Secondary | ICD-10-CM | POA: Insufficient documentation

## 2023-03-17 DIAGNOSIS — N858 Other specified noninflammatory disorders of uterus: Secondary | ICD-10-CM | POA: Diagnosis not present

## 2023-03-17 HISTORY — PX: ROBOTIC ASSISTED TOTAL HYSTERECTOMY WITH BILATERAL SALPINGO OOPHERECTOMY: SHX6086

## 2023-03-17 HISTORY — DX: Mononeuropathy, unspecified: G58.9

## 2023-03-17 HISTORY — PX: CYSTOSCOPY: SHX5120

## 2023-03-17 HISTORY — DX: Gastro-esophageal reflux disease without esophagitis: K21.9

## 2023-03-17 LAB — TYPE AND SCREEN
ABO/RH(D): O POS
Antibody Screen: NEGATIVE

## 2023-03-17 LAB — ABO/RH: ABO/RH(D): O POS

## 2023-03-17 SURGERY — HYSTERECTOMY, TOTAL, ROBOT-ASSISTED, LAPAROSCOPIC, WITH BILATERAL SALPINGO-OOPHORECTOMY
Anesthesia: General | Site: Pelvis

## 2023-03-17 MED ORDER — GABAPENTIN 300 MG PO CAPS
ORAL_CAPSULE | ORAL | Status: AC
  Filled 2023-03-17: qty 1

## 2023-03-17 MED ORDER — KETOROLAC TROMETHAMINE 30 MG/ML IJ SOLN: 30.0000 mg | Freq: Once | INTRAMUSCULAR | Status: AC | PRN

## 2023-03-17 MED ORDER — HYDROMORPHONE HCL 1 MG/ML IJ SOLN
0.2500 mg | INTRAMUSCULAR | Status: DC | PRN
  Administered 2023-03-17 (×2): 0.25 mg via INTRAVENOUS

## 2023-03-17 MED ORDER — ROCURONIUM BROMIDE 10 MG/ML (PF) SYRINGE
PREFILLED_SYRINGE | INTRAVENOUS | Status: AC
  Filled 2023-03-17: qty 10

## 2023-03-17 MED ORDER — LORATADINE 10 MG PO TABS
10.0000 mg | ORAL_TABLET | Freq: Every day | ORAL | Status: DC
  Administered 2023-03-17: 10 mg via ORAL

## 2023-03-17 MED ORDER — PROPOFOL 10 MG/ML IV BOLUS
INTRAVENOUS | Status: DC | PRN
  Administered 2023-03-17: 200 mg via INTRAVENOUS

## 2023-03-17 MED ORDER — FLUORESCEIN SODIUM 10 % IV SOLN
INTRAVENOUS | Status: DC | PRN
Start: 2023-03-17 — End: 2023-03-17
  Administered 2023-03-17: .5 mL via INTRAVENOUS

## 2023-03-17 MED ORDER — SUGAMMADEX SODIUM 200 MG/2ML IV SOLN
INTRAVENOUS | Status: DC | PRN
  Administered 2023-03-17: 400 mg via INTRAVENOUS

## 2023-03-17 MED ORDER — OXYCODONE HCL 5 MG PO TABS
ORAL_TABLET | ORAL | Status: AC
  Filled 2023-03-17: qty 1

## 2023-03-17 MED ORDER — PHENYLEPHRINE 80 MCG/ML (10ML) SYRINGE FOR IV PUSH (FOR BLOOD PRESSURE SUPPORT)
PREFILLED_SYRINGE | INTRAVENOUS | Status: AC
  Filled 2023-03-17: qty 10

## 2023-03-17 MED ORDER — MEPERIDINE HCL 25 MG/ML IJ SOLN: 6.2500 mg | INTRAMUSCULAR | Status: DC | PRN

## 2023-03-17 MED ORDER — EZETIMIBE 10 MG PO TABS: 10.0000 mg | ORAL_TABLET | Freq: Every day | ORAL | Status: DC

## 2023-03-17 MED ORDER — HEMOSTATIC AGENTS (NO CHARGE) OPTIME
TOPICAL | Status: DC | PRN
Start: 2023-03-17 — End: 2023-03-17
  Administered 2023-03-17: 1

## 2023-03-17 MED ORDER — SIMETHICONE 80 MG PO CHEW
80.0000 mg | CHEWABLE_TABLET | Freq: Four times a day (QID) | ORAL | Status: DC | PRN
  Administered 2023-03-17: 80 mg via ORAL

## 2023-03-17 MED ORDER — LIDOCAINE 2% (20 MG/ML) 5 ML SYRINGE
INTRAMUSCULAR | Status: DC | PRN
  Administered 2023-03-17: 100 mg via INTRAVENOUS

## 2023-03-17 MED ORDER — SODIUM CHLORIDE 0.9 % IV SOLN
INTRAVENOUS | Status: DC | PRN
  Administered 2023-03-17: 60 mL

## 2023-03-17 MED ORDER — PANTOPRAZOLE SODIUM 40 MG PO TBEC: 40.0000 mg | DELAYED_RELEASE_TABLET | Freq: Every day | ORAL | Status: DC

## 2023-03-17 MED ORDER — LACTATED RINGERS IV SOLN: INTRAVENOUS | Status: DC

## 2023-03-17 MED ORDER — PROMETHAZINE HCL 25 MG/ML IJ SOLN: 6.2500 mg | INTRAMUSCULAR | Status: DC | PRN

## 2023-03-17 MED ORDER — SIMETHICONE 80 MG PO CHEW
CHEWABLE_TABLET | ORAL | Status: AC
  Filled 2023-03-17: qty 1

## 2023-03-17 MED ORDER — PHENYLEPHRINE 80 MCG/ML (10ML) SYRINGE FOR IV PUSH (FOR BLOOD PRESSURE SUPPORT)
PREFILLED_SYRINGE | INTRAVENOUS | Status: DC | PRN
  Administered 2023-03-17 (×3): 160 ug via INTRAVENOUS
  Administered 2023-03-17: 80 ug via INTRAVENOUS

## 2023-03-17 MED ORDER — SODIUM CHLORIDE 0.9 % IR SOLN
Status: DC | PRN
  Administered 2023-03-17: 1000 mL
  Administered 2023-03-17: 1000 mL via INTRAVESICAL

## 2023-03-17 MED ORDER — FLEET ENEMA RE ENEM: 1.0000 | ENEMA | Freq: Once | RECTAL | Status: DC | PRN

## 2023-03-17 MED ORDER — BUPIVACAINE HCL 0.25 % IJ SOLN
INTRAMUSCULAR | Status: DC | PRN
Start: 2023-03-17 — End: 2023-03-17
  Administered 2023-03-17: 20 mL

## 2023-03-17 MED ORDER — LISINOPRIL-HYDROCHLOROTHIAZIDE 10-12.5 MG PO TABS: 1.0000 | ORAL_TABLET | Freq: Every day | ORAL | Status: DC

## 2023-03-17 MED ORDER — DOCUSATE SODIUM 100 MG PO CAPS
ORAL_CAPSULE | ORAL | Status: AC
  Filled 2023-03-17: qty 1

## 2023-03-17 MED ORDER — ONDANSETRON HCL 4 MG PO TABS: 4.0000 mg | ORAL_TABLET | Freq: Four times a day (QID) | ORAL | Status: DC | PRN

## 2023-03-17 MED ORDER — GABAPENTIN 300 MG PO CAPS
300.0000 mg | ORAL_CAPSULE | Freq: Two times a day (BID) | ORAL | Status: DC
  Administered 2023-03-17: 300 mg via ORAL

## 2023-03-17 MED ORDER — METOPROLOL SUCCINATE ER 50 MG PO TB24: 50.0000 mg | ORAL_TABLET | Freq: Every day | ORAL | Status: DC

## 2023-03-17 MED ORDER — 0.9 % SODIUM CHLORIDE (POUR BTL) OPTIME
TOPICAL | Status: DC | PRN
  Administered 2023-03-17: 500 mL

## 2023-03-17 MED ORDER — ALBUTEROL SULFATE HFA 108 (90 BASE) MCG/ACT IN AERS
INHALATION_SPRAY | RESPIRATORY_TRACT | Status: AC
  Filled 2023-03-17: qty 6.7

## 2023-03-17 MED ORDER — MIDAZOLAM HCL 5 MG/5ML IJ SOLN
INTRAMUSCULAR | Status: DC | PRN
  Administered 2023-03-17: 1 mg via INTRAVENOUS

## 2023-03-17 MED ORDER — CEFAZOLIN SODIUM-DEXTROSE 2-4 GM/100ML-% IV SOLN
INTRAVENOUS | Status: AC
  Filled 2023-03-17: qty 100

## 2023-03-17 MED ORDER — MENTHOL 3 MG MT LOZG
1.0000 | LOZENGE | OROMUCOSAL | Status: DC | PRN
  Administered 2023-03-17: 3 mg via ORAL

## 2023-03-17 MED ORDER — ONDANSETRON HCL 4 MG/2ML IJ SOLN
INTRAMUSCULAR | Status: DC | PRN
  Administered 2023-03-17: 4 mg via INTRAVENOUS

## 2023-03-17 MED ORDER — LORATADINE 10 MG PO TABS
ORAL_TABLET | ORAL | Status: AC
  Filled 2023-03-17: qty 1

## 2023-03-17 MED ORDER — ONDANSETRON HCL 4 MG/2ML IJ SOLN
INTRAMUSCULAR | Status: AC
  Filled 2023-03-17: qty 2

## 2023-03-17 MED ORDER — HYDROMORPHONE HCL 1 MG/ML IJ SOLN: 0.2000 mg | INTRAMUSCULAR | Status: DC | PRN

## 2023-03-17 MED ORDER — CELECOXIB 200 MG PO CAPS
ORAL_CAPSULE | ORAL | Status: AC
  Filled 2023-03-17: qty 1

## 2023-03-17 MED ORDER — ONDANSETRON HCL 4 MG/2ML IJ SOLN: 4.0000 mg | Freq: Four times a day (QID) | INTRAMUSCULAR | Status: DC | PRN

## 2023-03-17 MED ORDER — ALBUTEROL SULFATE HFA 108 (90 BASE) MCG/ACT IN AERS
INHALATION_SPRAY | RESPIRATORY_TRACT | Status: DC | PRN
Start: 2023-03-17 — End: 2023-03-17
  Administered 2023-03-17: 4 via RESPIRATORY_TRACT

## 2023-03-17 MED ORDER — WHITE PETROLATUM EX OINT
TOPICAL_OINTMENT | CUTANEOUS | Status: AC
  Filled 2023-03-17: qty 5

## 2023-03-17 MED ORDER — POLYETHYLENE GLYCOL 3350 17 G PO PACK: 17.0000 g | PACK | Freq: Every day | ORAL | Status: DC | PRN

## 2023-03-17 MED ORDER — PROPOFOL 10 MG/ML IV BOLUS
INTRAVENOUS | Status: AC
  Filled 2023-03-17: qty 20

## 2023-03-17 MED ORDER — KETOROLAC TROMETHAMINE 30 MG/ML IJ SOLN
30.0000 mg | Freq: Four times a day (QID) | INTRAMUSCULAR | Status: AC
  Administered 2023-03-17 – 2023-03-18 (×3): 30 mg via INTRAVENOUS

## 2023-03-17 MED ORDER — DEXAMETHASONE SODIUM PHOSPHATE 10 MG/ML IJ SOLN
INTRAMUSCULAR | Status: AC
  Filled 2023-03-17: qty 1

## 2023-03-17 MED ORDER — CEFAZOLIN SODIUM-DEXTROSE 2-4 GM/100ML-% IV SOLN
2.0000 g | INTRAVENOUS | Status: AC
  Administered 2023-03-17: 2 g via INTRAVENOUS

## 2023-03-17 MED ORDER — FENTANYL CITRATE (PF) 100 MCG/2ML IJ SOLN
INTRAMUSCULAR | Status: DC | PRN
  Administered 2023-03-17: 150 ug via INTRAVENOUS

## 2023-03-17 MED ORDER — METFORMIN HCL ER 500 MG PO TB24
500.0000 mg | ORAL_TABLET | Freq: Every day | ORAL | Status: DC
  Administered 2023-03-17: 500 mg via ORAL
  Filled 2023-03-17: qty 1

## 2023-03-17 MED ORDER — BISACODYL 10 MG RE SUPP: 10.0000 mg | Freq: Every day | RECTAL | Status: DC | PRN

## 2023-03-17 MED ORDER — MENTHOL 3 MG MT LOZG
LOZENGE | OROMUCOSAL | Status: AC
  Filled 2023-03-17: qty 9

## 2023-03-17 MED ORDER — DEXMEDETOMIDINE HCL IN NACL 80 MCG/20ML IV SOLN
INTRAVENOUS | Status: AC
  Filled 2023-03-17: qty 20

## 2023-03-17 MED ORDER — HYDROMORPHONE HCL 1 MG/ML IJ SOLN
INTRAMUSCULAR | Status: AC
  Filled 2023-03-17: qty 1

## 2023-03-17 MED ORDER — CELECOXIB 200 MG PO CAPS
400.0000 mg | ORAL_CAPSULE | ORAL | Status: AC
  Administered 2023-03-17: 400 mg via ORAL

## 2023-03-17 MED ORDER — DEXAMETHASONE SODIUM PHOSPHATE 10 MG/ML IJ SOLN
INTRAMUSCULAR | Status: DC | PRN
  Administered 2023-03-17: 5 mg via INTRAVENOUS

## 2023-03-17 MED ORDER — MIDAZOLAM HCL 2 MG/2ML IJ SOLN
INTRAMUSCULAR | Status: AC
  Filled 2023-03-17: qty 2

## 2023-03-17 MED ORDER — DOCUSATE SODIUM 100 MG PO CAPS
100.0000 mg | ORAL_CAPSULE | Freq: Two times a day (BID) | ORAL | Status: DC
  Administered 2023-03-17 (×2): 100 mg via ORAL

## 2023-03-17 MED ORDER — IBUPROFEN 200 MG PO TABS: 600.0000 mg | ORAL_TABLET | Freq: Four times a day (QID) | ORAL | Status: DC

## 2023-03-17 MED ORDER — KETOROLAC TROMETHAMINE 30 MG/ML IJ SOLN
INTRAMUSCULAR | Status: AC
  Filled 2023-03-17: qty 1

## 2023-03-17 MED ORDER — FENTANYL CITRATE (PF) 250 MCG/5ML IJ SOLN
INTRAMUSCULAR | Status: AC
  Filled 2023-03-17: qty 5

## 2023-03-17 MED ORDER — ROCURONIUM BROMIDE 10 MG/ML (PF) SYRINGE
PREFILLED_SYRINGE | INTRAVENOUS | Status: DC | PRN
  Administered 2023-03-17: 70 mg via INTRAVENOUS
  Administered 2023-03-17: 20 mg via INTRAVENOUS

## 2023-03-17 MED ORDER — OXYCODONE HCL 5 MG PO TABS
5.0000 mg | ORAL_TABLET | ORAL | Status: DC | PRN
  Administered 2023-03-17 – 2023-03-18 (×6): 5 mg via ORAL

## 2023-03-17 MED ORDER — DEXMEDETOMIDINE HCL IN NACL 80 MCG/20ML IV SOLN
INTRAVENOUS | Status: DC | PRN
  Administered 2023-03-17: 16 ug via INTRAVENOUS

## 2023-03-17 SURGICAL SUPPLY — 60 items
ADH SKN CLS APL DERMABOND .7 (GAUZE/BANDAGES/DRESSINGS) ×2
APL SRG 38 LTWT LNG FL B (MISCELLANEOUS) ×2
APPLICATOR ARISTA FLEXITIP XL (MISCELLANEOUS) IMPLANT
BARRIER ADHS 3X4 INTERCEED (GAUZE/BANDAGES/DRESSINGS) IMPLANT
BRR ADH 4X3 ABS CNTRL BYND (GAUZE/BANDAGES/DRESSINGS)
COVER BACK TABLE 60X90IN (DRAPES) ×2 IMPLANT
COVER TIP SHEARS 8 DVNC (MISCELLANEOUS) ×2 IMPLANT
DEFOGGER SCOPE WARMER CLEARIFY (MISCELLANEOUS) ×2 IMPLANT
DERMABOND ADVANCED .7 DNX12 (GAUZE/BANDAGES/DRESSINGS) ×2 IMPLANT
DRAPE ARM DVNC X/XI (DISPOSABLE) ×8 IMPLANT
DRAPE COLUMN DVNC XI (DISPOSABLE) ×2 IMPLANT
DRAPE SURG IRRIG POUCH 19X23 (DRAPES) ×2 IMPLANT
DRAPE UTILITY XL STRL (DRAPES) ×2 IMPLANT
DRIVER NDL MEGA SUTCUT DVNCXI (INSTRUMENTS) ×2 IMPLANT
DRIVER NDLE MEGA SUTCUT DVNCXI (INSTRUMENTS) ×2 IMPLANT
DURAPREP 26ML APPLICATOR (WOUND CARE) ×2 IMPLANT
ELECT REM PT RETURN 9FT ADLT (ELECTROSURGICAL) ×2
ELECTRODE REM PT RTRN 9FT ADLT (ELECTROSURGICAL) ×2 IMPLANT
FORCEPS BPLR FENES DVNC XI (FORCEP) ×2 IMPLANT
FORCEPS PROGRASP DVNC XI (FORCEP) IMPLANT
FORCEPS TENACULUM DVNC XI (FORCEP) IMPLANT
GAUZE 4X4 16PLY ~~LOC~~+RFID DBL (SPONGE) IMPLANT
GLOVE BIO SURGEON STRL SZ 6 (GLOVE) ×6 IMPLANT
GLOVE BIOGEL PI IND STRL 6.5 (GLOVE) ×6 IMPLANT
HEMOSTAT ARISTA ABSORB 3G PWDR (HEMOSTASIS) IMPLANT
HOLDER FOLEY CATH W/STRAP (MISCELLANEOUS) IMPLANT
IRRIG SUCT STRYKERFLOW 2 WTIP (MISCELLANEOUS) ×2
IRRIGATION SUCT STRKRFLW 2 WTP (MISCELLANEOUS) ×2 IMPLANT
KIT PINK PAD W/HEAD ARE REST (MISCELLANEOUS) ×2
KIT PINK PAD W/HEAD ARM REST (MISCELLANEOUS) ×2 IMPLANT
KIT TURNOVER CYSTO (KITS) ×2 IMPLANT
LEGGING LITHOTOMY PAIR STRL (DRAPES) ×2 IMPLANT
MANIPULATOR ADVINCU DEL 2.5 PL (MISCELLANEOUS) IMPLANT
MANIPULATOR ADVINCU DEL 3.0 PL (MISCELLANEOUS) IMPLANT
MANIPULATOR ADVINCU DEL 3.5 PL (MISCELLANEOUS) IMPLANT
MANIPULATOR ADVINCU DEL 4.0 PL (MISCELLANEOUS) IMPLANT
NDL INSUFFLATION 14GA 120MM (NEEDLE) ×2 IMPLANT
NEEDLE INSUFFLATION 14GA 120MM (NEEDLE) ×2 IMPLANT
OBTURATOR OPTICAL STND 8 DVNC (TROCAR) ×2
OBTURATOR OPTICALSTD 8 DVNC (TROCAR) ×2 IMPLANT
PACK ROBOT WH (CUSTOM PROCEDURE TRAY) ×2 IMPLANT
PACK ROBOTIC GOWN (GOWN DISPOSABLE) ×2 IMPLANT
PAD OB MATERNITY 4.3X12.25 (PERSONAL CARE ITEMS) ×2 IMPLANT
PAD PREP 24X48 CUFFED NSTRL (MISCELLANEOUS) ×2 IMPLANT
POUCH LAPAROSCOPIC INSTRUMENT (MISCELLANEOUS) IMPLANT
PROTECTOR NERVE ULNAR (MISCELLANEOUS) ×4 IMPLANT
SCISSORS MNPLR CVD DVNC XI (INSTRUMENTS) ×2 IMPLANT
SEAL UNIV 5-12 XI (MISCELLANEOUS) ×6 IMPLANT
SET IRRIG Y TYPE TUR BLADDER L (SET/KITS/TRAYS/PACK) ×2 IMPLANT
SET TRI-LUMEN FLTR TB AIRSEAL (TUBING) ×2 IMPLANT
SLEEVE SCD COMPRESS KNEE MED (STOCKING) ×2 IMPLANT
SPIKE FLUID TRANSFER (MISCELLANEOUS) ×2 IMPLANT
SUT VIC AB 0 CT1 36 (SUTURE) ×2 IMPLANT
SUT VICRYL RAPIDE 3 0 (SUTURE) ×4 IMPLANT
SUT VLOC 180 0 9IN GS21 (SUTURE) ×2 IMPLANT
TOWEL OR 17X24 6PK STRL BLUE (TOWEL DISPOSABLE) ×2 IMPLANT
TRAY FOLEY W/BAG SLVR 14FR LF (SET/KITS/TRAYS/PACK) ×2 IMPLANT
TROCAR PORT AIRSEAL 5X120 (TROCAR) ×2 IMPLANT
TROCAR Z-THREAD FIOS 5X100MM (TROCAR) IMPLANT
WATER STERILE IRR 1000ML POUR (IV SOLUTION) ×2 IMPLANT

## 2023-03-17 NOTE — Anesthesia Preprocedure Evaluation (Signed)
Anesthesia Evaluation  Patient identified by MRN, date of birth, ID band Patient awake    Reviewed: Allergy & Precautions, NPO status , Patient's Chart, lab work & pertinent test results, reviewed documented beta blocker date and time   History of Anesthesia Complications (+) PROLONGED EMERGENCE and history of anesthetic complications  Airway Mallampati: I       Dental no notable dental hx.    Pulmonary sleep apnea , former smoker   Pulmonary exam normal        Cardiovascular hypertension, Pt. on medications and Pt. on home beta blockers Normal cardiovascular exam     Neuro/Psych negative neurological ROS     GI/Hepatic Neg liver ROS,GERD  Medicated and Controlled,,  Endo/Other  diabetes, Type 2, Oral Hypoglycemic Agents    Renal/GU negative Renal ROS  negative genitourinary   Musculoskeletal   Abdominal  (+) + obese  Peds  Hematology negative hematology ROS (+)   Anesthesia Other Findings   Reproductive/Obstetrics                             Anesthesia Physical Anesthesia Plan  ASA: 2  Anesthesia Plan: General   Post-op Pain Management: Minimal or no pain anticipated   Induction: Intravenous  PONV Risk Score and Plan: 4 or greater and Ondansetron, Midazolam and Treatment may vary due to age or medical condition  Airway Management Planned: Oral ETT  Additional Equipment: None  Intra-op Plan:   Post-operative Plan: Extubation in OR  Informed Consent: I have reviewed the patients History and Physical, chart, labs and discussed the procedure including the risks, benefits and alternatives for the proposed anesthesia with the patient or authorized representative who has indicated his/her understanding and acceptance.     Dental advisory given  Plan Discussed with:   Anesthesia Plan Comments:        Anesthesia Quick Evaluation

## 2023-03-17 NOTE — Op Note (Signed)
03/17/2023   10:56 AM   PATIENT:  Gina Norman  57 y.o. female   PRE-OPERATIVE DIAGNOSIS:  Lynch syndrome   POST-OPERATIVE DIAGNOSIS:  Lynch syndrome   PROCEDURE:  Procedure(s): XI ROBOTIC ASSISTED TOTAL HYSTERECTOMY WITH BILATERAL SALPINGO OOPHORECTOMY (N/A) CYSTOSCOPY (N/A)   SURGEON:  Surgeons and Role:    * Charlett Nose, MD - Primary    * Carrington Clamp, MD - Assisting   ANESTHESIA:   local and general   EBL:  50 mL    BLOOD ADMINISTERED:none   DRAINS: Urinary Catheter (Foley)    LOCAL MEDICATIONS USED:  MARCAINE    and Amount: 20 ml   SPECIMEN:  Source of Specimen:  uterus, cervix, bilateral fallopian tubes, bilateral ovaries   DISPOSITION OF SPECIMEN:  PATHOLOGY   COUNTS:  YES   TOURNIQUET:  * No tourniquets in log *   DICTATION: .Note written in EPIC   PLAN OF CARE: Admit for overnight observation   PATIENT DISPOSITION:  PACU - hemodynamically stable.   Delay start of Pharmacological VTE agent (>24hrs) due to surgical blood loss or risk of bleeding: not applicable   FINDINGS:  On bimanual exam, small uterus sounded to 8cm.  On laparoscopic view, normal appearing uterus, bilateral fallopian tubes and bilateral ovaries. Normal appearing ureters bilaterally. Normal appearing appendix.  Cystoscopy demonstrated intact bladder and bilateral ureteral jets.   COMPLICATIONS: None   DESCRIPTION OF PROCEDURE: After consent was verified the patient was taken to the operating room.  After adequate anesthesia was achieved the patient was positioned in the dorsal lithotomy position with legs in stirrups. SCDs were in place and cycling.  Exam revealed a 8 week size uterus. The patient was prepped and draped in normal sterile fashion. Ancef 2g was given for infection prophylaxis. A speculum was placed in the vagina and the anterior lip of the cervix was grasped with a tenaculum. A stay suture was placed on the anterior lip of the cervix. The uterus was sounded  to 8cm and the cervix dilated with [facility] dilators.  The 3.0 cm delineating ring was assembled and placed in the proper fashion.  The speculum was removed and the bladder was drained with a Foley catheter.   Attention was turned to the abdomen. An 8mm incision was made 1 cm above the umbilicus and the Veress needle was inserted through the incision. Correct placement was confirmed by opening pressure of . The abdomen was insufflated with CO2 gas to a total pressure of . The 8 mm robotic trochar was inserted into the abdominal cavity. The laparoscope was inserted and confirmed no vascular or visceral trauma from entry. The patient was placed in steep Trendelenburg.  Two additional 8mm robotic trochars were placed, one on each side of the abdomen. An 5mm Airseal assistant port was placed on the left side. All trochars were inserted under direct visualization of the camera.  The robot was docked.  The fenestrated bipolar was placed on arm 1 and the monopolar scissors on arm 3 and introduced under direct visualization of the camera.   Survey of the abdomen revealed findings as noted above. Bilateral  ureters were visualized peristalsing. The left round ligament was transected. The peritoneum lateral and parallel to the infundibulopelvic ligament was opened. A peritoneal window was created to isolate the infundibulopelvic ligament from the ureter. The infundibulopelvic ligament was ligated.  The vesicouterine peritoneum was opened and the bladder was dissected off the lower uterine segment and upper vagina. The left posterior broad  ligament was skeletonized. The left uterine vessels were dessicated with bipolar cautery and transected with monopolar scissors. The same process was repeated on the right side. The cervicovaginal junction was identified with the delineating ring and the monopolar scissors were used to enter the vagina and incise circumferentially around the cervicovaginal junction. The uterus,  cervix, bilateral fallopian tubes, and bilateral ovaries were removed through the vagina and sent for pathology. The vaginal cuff was closed with V loc suture in a running fashion. The suture was removed. An assistant palpated the vaginal cuff from the vaginal field and confirmed the vaginal cuff was intact without defect. The abdomen was irrigated and suctioned. Arista was applied to the vesicouterine peritoneum. The pelvis was observed to be hemostatic at and . The robot was undocked and Ropivicaine was introduced into the pelvis. All ports were removed. Fluorescein was administered.    The port sites were closed with 3-0 vicryl in a subcuticular fashion and covered with skin glue. 20mL of 0.25% marcaine was injected into the port site incisions.   The foley catheter was removed. A cystoscope was inserted into the bladder and the bladder was distended with saline. Survey revealed intact bladder and bilateral ureteral jets were appreciated. The bladder was drained and foley reintroduced. A speculum was inserted into the vagina and vaginal cuff appeared intact and hemostatic. A manual exam again confirmed the vaginal cuff was intact without defect.    The patient was awakened from anesthesia and taken to the recovery room in stable condition.  Derl Barrow, MD 03/17/23 11:03 AM

## 2023-03-17 NOTE — Anesthesia Postprocedure Evaluation (Signed)
Anesthesia Post Note  Patient: Gina Norman  Procedure(s) Performed: XI ROBOTIC ASSISTED TOTAL HYSTERECTOMY WITH BILATERAL SALPINGO OOPHORECTOMY (Pelvis) CYSTOSCOPY (Bladder)     Patient location during evaluation: PACU Anesthesia Type: General Level of consciousness: awake Pain management: pain level controlled Vital Signs Assessment: post-procedure vital signs reviewed and stable Respiratory status: spontaneous breathing Cardiovascular status: stable Postop Assessment: no apparent nausea or vomiting Anesthetic complications: no  No notable events documented.  Last Vitals:  Vitals:   03/17/23 1230 03/17/23 1245  BP: 108/81 98/68  Pulse: 65 60  Resp: 14 16  Temp: 36.5 C   SpO2: 92% 90%    Last Pain:  Vitals:   03/17/23 1230  TempSrc:   PainSc: 3                  John F Stephen Baruch Jr

## 2023-03-17 NOTE — Brief Op Note (Signed)
03/17/2023  10:56 AM  PATIENT:  Gina Norman  57 y.o. female  PRE-OPERATIVE DIAGNOSIS:  Lynch syndrome  POST-OPERATIVE DIAGNOSIS:  Lynch syndrome  PROCEDURE:  Procedure(s): XI ROBOTIC ASSISTED TOTAL HYSTERECTOMY WITH BILATERAL SALPINGO OOPHORECTOMY (N/A) CYSTOSCOPY (N/A)  SURGEON:  Surgeons and Role:    * Charlett Nose, MD - Primary    * Carrington Clamp, MD - Assisting  ANESTHESIA:   local and general  EBL:  50 mL   BLOOD ADMINISTERED:none  DRAINS: Urinary Catheter (Foley)   LOCAL MEDICATIONS USED:  MARCAINE    and Amount: 20 ml  SPECIMEN:  Source of Specimen:  uterus, cervix, bilateral fallopian tubes, bilateral ovaries  DISPOSITION OF SPECIMEN:  PATHOLOGY  COUNTS:  YES  TOURNIQUET:  * No tourniquets in log *  DICTATION: .Note written in EPIC  PLAN OF CARE: Admit for overnight observation  PATIENT DISPOSITION:  PACU - hemodynamically stable.   Delay start of Pharmacological VTE agent (>24hrs) due to surgical blood loss or risk of bleeding: not applicable

## 2023-03-17 NOTE — Anesthesia Procedure Notes (Signed)
Procedure Name: Intubation Date/Time: 03/17/2023 9:26 AM  Performed by: Briant Sites, CRNAPre-anesthesia Checklist: Patient identified, Emergency Drugs available, Suction available and Patient being monitored Patient Re-evaluated:Patient Re-evaluated prior to induction Oxygen Delivery Method: Circle system utilized Preoxygenation: Pre-oxygenation with 100% oxygen Induction Type: IV induction Ventilation: Mask ventilation without difficulty Laryngoscope Size: Mac and 3 Grade View: Grade I Tube type: Oral Tube size: 7.0 mm Number of attempts: 1 Airway Equipment and Method: Stylet Placement Confirmation: ETT inserted through vocal cords under direct vision, positive ETCO2 and breath sounds checked- equal and bilateral Secured at: 20 cm Tube secured with: Tape Dental Injury: Teeth and Oropharynx as per pre-operative assessment

## 2023-03-17 NOTE — Interval H&P Note (Signed)
History and Physical Interval Note:  03/17/2023 8:47 AM  Gina Norman  has presented today for surgery, with the diagnosis of lynch syndrome.  The various methods of treatment have been discussed with the patient and family. After consideration of risks, benefits and other options for treatment, the patient has consented to  Procedure(s): XI ROBOTIC ASSISTED TOTAL HYSTERECTOMY WITH BILATERAL SALPINGO OOPHORECTOMY (N/A) CYSTOSCOPY (N/A) as a surgical intervention.  The patient's history has been reviewed, patient examined, no change in status, stable for surgery.  I have reviewed the patient's chart and labs.  Questions were answered to the patient's satisfaction.     Charlett Nose

## 2023-03-17 NOTE — Transfer of Care (Signed)
Immediate Anesthesia Transfer of Care Note  Patient: Wendee Beavers  Procedure(s) Performed: XI ROBOTIC ASSISTED TOTAL HYSTERECTOMY WITH BILATERAL SALPINGO OOPHORECTOMY (Pelvis) CYSTOSCOPY (Bladder)  Patient Location: PACU  Anesthesia Type:General  Level of Consciousness: drowsy  Airway & Oxygen Therapy: Patient Spontanous Breathing and Patient connected to nasal cannula oxygen  Post-op Assessment: Report given to RN  Post vital signs: Reviewed and stable  Last Vitals:  Vitals Value Taken Time  BP 107/66 03/17/23 1100  Temp    Pulse 77 03/17/23 1101  Resp 19 03/17/23 1101  SpO2 95 % 03/17/23 1101  Vitals shown include unfiled device data.  Last Pain:  Vitals:   03/17/23 0750  TempSrc: Oral  PainSc: 2       Patients Stated Pain Goal: 4 (03/17/23 0750)  Complications: No notable events documented.

## 2023-03-18 DIAGNOSIS — I10 Essential (primary) hypertension: Secondary | ICD-10-CM | POA: Diagnosis not present

## 2023-03-18 DIAGNOSIS — Z7984 Long term (current) use of oral hypoglycemic drugs: Secondary | ICD-10-CM | POA: Diagnosis not present

## 2023-03-18 DIAGNOSIS — K219 Gastro-esophageal reflux disease without esophagitis: Secondary | ICD-10-CM | POA: Diagnosis not present

## 2023-03-18 DIAGNOSIS — Z803 Family history of malignant neoplasm of breast: Secondary | ICD-10-CM | POA: Diagnosis not present

## 2023-03-18 DIAGNOSIS — Z87891 Personal history of nicotine dependence: Secondary | ICD-10-CM | POA: Diagnosis not present

## 2023-03-18 DIAGNOSIS — E669 Obesity, unspecified: Secondary | ICD-10-CM | POA: Diagnosis not present

## 2023-03-18 DIAGNOSIS — E282 Polycystic ovarian syndrome: Secondary | ICD-10-CM | POA: Diagnosis not present

## 2023-03-18 DIAGNOSIS — G4730 Sleep apnea, unspecified: Secondary | ICD-10-CM | POA: Diagnosis not present

## 2023-03-18 DIAGNOSIS — Z6836 Body mass index (BMI) 36.0-36.9, adult: Secondary | ICD-10-CM | POA: Diagnosis not present

## 2023-03-18 DIAGNOSIS — Z4009 Encounter for prophylactic removal of other organ: Secondary | ICD-10-CM | POA: Diagnosis not present

## 2023-03-18 DIAGNOSIS — E119 Type 2 diabetes mellitus without complications: Secondary | ICD-10-CM | POA: Diagnosis not present

## 2023-03-18 DIAGNOSIS — D259 Leiomyoma of uterus, unspecified: Secondary | ICD-10-CM | POA: Diagnosis not present

## 2023-03-18 DIAGNOSIS — Z1509 Genetic susceptibility to other malignant neoplasm: Secondary | ICD-10-CM | POA: Diagnosis not present

## 2023-03-18 LAB — CBC
HCT: 35 % — ABNORMAL LOW (ref 36.0–46.0)
Hemoglobin: 11.6 g/dL — ABNORMAL LOW (ref 12.0–15.0)
MCH: 31.9 pg (ref 26.0–34.0)
MCHC: 33.1 g/dL (ref 30.0–36.0)
MCV: 96.2 fL (ref 80.0–100.0)
Platelets: 149 10*3/uL — ABNORMAL LOW (ref 150–400)
RBC: 3.64 MIL/uL — ABNORMAL LOW (ref 3.87–5.11)
RDW: 11.7 % (ref 11.5–15.5)
WBC: 9.8 10*3/uL (ref 4.0–10.5)
nRBC: 0 % (ref 0.0–0.2)

## 2023-03-18 LAB — COMPREHENSIVE METABOLIC PANEL
ALT: 35 U/L (ref 0–44)
AST: 35 U/L (ref 15–41)
Albumin: 3.6 g/dL (ref 3.5–5.0)
Alkaline Phosphatase: 49 U/L (ref 38–126)
Anion gap: 8 (ref 5–15)
BUN: 15 mg/dL (ref 6–20)
CO2: 25 mmol/L (ref 22–32)
Calcium: 8.6 mg/dL — ABNORMAL LOW (ref 8.9–10.3)
Chloride: 102 mmol/L (ref 98–111)
Creatinine, Ser: 0.8 mg/dL (ref 0.44–1.00)
GFR, Estimated: >60 mL/min (ref >60)
Glucose, Bld: 121 mg/dL — ABNORMAL HIGH (ref 70–99)
Potassium: 4.1 mmol/L (ref 3.5–5.1)
Sodium: 135 mmol/L (ref 135–145)
Total Bilirubin: 0.9 mg/dL (ref 0.3–1.2)
Total Protein: 6.5 g/dL (ref 6.5–8.1)

## 2023-03-18 MED ORDER — OXYCODONE HCL 5 MG PO TABS
ORAL_TABLET | ORAL | Status: AC
  Filled 2023-03-18: qty 1

## 2023-03-18 MED ORDER — KETOROLAC TROMETHAMINE 30 MG/ML IJ SOLN
INTRAMUSCULAR | Status: AC
  Filled 2023-03-18: qty 1

## 2023-03-18 MED ORDER — POLYETHYLENE GLYCOL 3350 17 G PO PACK: 17.0000 g | PACK | Freq: Every day | ORAL | Status: AC | PRN

## 2023-03-18 MED ORDER — SIMETHICONE 80 MG PO CHEW: 80.0000 mg | CHEWABLE_TABLET | Freq: Four times a day (QID) | ORAL | Status: AC | PRN

## 2023-03-18 MED ORDER — DOCUSATE SODIUM 100 MG PO CAPS: 100.0000 mg | ORAL_CAPSULE | Freq: Two times a day (BID) | ORAL | Status: AC

## 2023-03-18 NOTE — Progress Notes (Signed)
GYN Progress Note  S: Feeling well this morning. Rates pain as 0/10 at rest. Ambulating, voiding, tolerating PO. No vaginal bleeding. No chest pain or SOB   O: Today's Vitals   03/18/23 0015 03/18/23 0219 03/18/23 0430 03/18/23 0625  BP:  101/65  101/65  Pulse:  71  75  Resp:  14  16  Temp:  97.8 F (36.6 C)  97.8 F (36.6 C)  TempSrc:      SpO2:  97%  97%  Weight:      Height:      PainSc: Asleep 0-No pain Asleep 1    Body mass index is 36.49 kg/m.  General: no acute distress, resting in bed CVS: regular rate and rhythm Lungs: clear to auscultation bilaterally Abd: soft, minimally distended, appropriately tender to palpation Incisions: 4 lap port site incisions clean dry and intact with skin glue Ext: no calf edema or tenderness, SCDs on and cycling  CBC    Component Value Date/Time   WBC 9.8 03/18/2023 0257   RBC 3.64 (L) 03/18/2023 0257   HGB 11.6 (L) 03/18/2023 0257   HCT 35.0 (L) 03/18/2023 0257   PLT 149 (L) 03/18/2023 0257   MCV 96.2 03/18/2023 0257   MCH 31.9 03/18/2023 0257   MCHC 33.1 03/18/2023 0257   RDW 11.7 03/18/2023 0257   CMP     Component Value Date/Time   NA 135 03/18/2023 0257   NA 138 03/23/2021 0812   K 4.1 03/18/2023 0257   CL 102 03/18/2023 0257   CO2 25 03/18/2023 0257   GLUCOSE 121 (H) 03/18/2023 0257   BUN 15 03/18/2023 0257   BUN 13 03/23/2021 0812   CREATININE 0.80 03/18/2023 0257   CALCIUM 8.6 (L) 03/18/2023 0257   PROT 6.5 03/18/2023 0257   PROT 7.5 03/23/2021 0812   ALBUMIN 3.6 03/18/2023 0257   ALBUMIN 4.9 03/23/2021 0812   AST 35 03/18/2023 0257   ALT 35 03/18/2023 0257   ALKPHOS 49 03/18/2023 0257   BILITOT 0.9 03/18/2023 0257   BILITOT 0.7 03/23/2021 0812   EGFR 81 03/23/2021 0812   GFRNONAA >60 03/18/2023 0257     A/P: POD1 s/p RA-TLH, BSO, cysto - Reviewed procedure and findings in detail. - Meeting postop milestones, stable for discharge home. Instructions reviewed. Already has prescriptions at home.  -  Follow up appt in 2 weeks  M. Athanasios Heldman MD 03/18/23 8:28 AM

## 2023-03-18 NOTE — Discharge Summary (Signed)
Physician Discharge Summary  Patient ID: Gina Norman MRN: [id number] DOB/AGE: 03/13/1966 57 y.o.  Admit date: 03/17/2023 Discharge date: 03/18/2023  Admission Diagnoses: Lynch syndrome  Discharge Diagnoses:  Principal Problem:   H/O total hysterectomy   Discharged Condition: good  Hospital Course:  03/17/23 underwent scheduled robot assisted total laparoscopic hysterectomy, bilateral salpingo-oophorectomy, cystoscopy. Foley removed postoperative. Ambulating, voiding, tolerating PO 03/18/23 meeting postop milestones, discharged home  Consults: None  Significant Diagnostic Studies:  CBC    Component Value Date/Time   WBC 9.8 03/18/2023 0257   RBC 3.64 (L) 03/18/2023 0257   HGB 11.6 (L) 03/18/2023 0257   HCT 35.0 (L) 03/18/2023 0257   PLT 149 (L) 03/18/2023 0257   MCV 96.2 03/18/2023 0257   MCH 31.9 03/18/2023 0257   MCHC 33.1 03/18/2023 0257   RDW 11.7 03/18/2023 0257   CMP     Component Value Date/Time   NA 135 03/18/2023 0257   NA 138 03/23/2021 0812   K 4.1 03/18/2023 0257   CL 102 03/18/2023 0257   CO2 25 03/18/2023 0257   GLUCOSE 121 (H) 03/18/2023 0257   BUN 15 03/18/2023 0257   BUN 13 03/23/2021 0812   CREATININE 0.80 03/18/2023 0257   CALCIUM 8.6 (L) 03/18/2023 0257   PROT 6.5 03/18/2023 0257   PROT 7.5 03/23/2021 0812   ALBUMIN 3.6 03/18/2023 0257   ALBUMIN 4.9 03/23/2021 0812   AST 35 03/18/2023 0257   ALT 35 03/18/2023 0257   ALKPHOS 49 03/18/2023 0257   BILITOT 0.9 03/18/2023 0257   BILITOT 0.7 03/23/2021 0812   EGFR 81 03/23/2021 0812   GFRNONAA >60 03/18/2023 0257     Treatments: surgery: as above  Discharge Exam: Blood pressure 101/65, pulse 75, temperature 97.8 F (36.6 C), resp. rate 16, height 5\' 3"  (1.6 m), weight 93.4 kg, last menstrual period 09/03/2014, SpO2 97%. General: no acute distress, resting in bed CVS: regular rate and rhythm Lungs: clear to auscultation bilaterally Abd: soft, minimally distended, appropriately tender to  palpation Incisions: 4 lap port site incisions clean dry and intact with skin glue Ext: no calf edema or tenderness, SCDs on and cycling  Disposition: Discharge disposition: 01-Home or Self Care       Discharge Instructions     Call MD for:   Complete by: As directed    Call MD for:  difficulty breathing, headache or visual disturbances   Complete by: As directed    Call MD for:  extreme fatigue   Complete by: As directed    Call MD for:  hives   Complete by: As directed    Call MD for:  persistant dizziness or light-headedness   Complete by: As directed    Call MD for:  persistant nausea and vomiting   Complete by: As directed    Call MD for:  redness, tenderness, or signs of infection (pain, swelling, redness, odor or green/yellow discharge around incision site)   Complete by: As directed    Call MD for:  severe uncontrolled pain   Complete by: As directed    Call MD for:  temperature >100.4   Complete by: As directed    Diet - low sodium heart healthy   Complete by: As directed    Driving Restrictions   Complete by: As directed    No driving while taking narcotics   Increase activity slowly   Complete by: As directed    Lifting restrictions   Complete by: As directed    No  heavy lifting > 10 lbs   No dressing needed   Complete by: As directed    Other Restrictions   Complete by: As directed    No soaking in tub/pool for at least 6 weeks   Sexual Activity Restrictions   Complete by: As directed    Nothing in vagina for at least 6 weeks until cleared by physician      Allergies as of 03/18/2023       Reactions   Ketamine    paranoid   Erythromycin Nausea Only   Upset stomach    Gold-containing Drug Products    Patch test abnormal at allergy specialist.   Welchol [colesevelam] Rash        Medication List     TAKE these medications    Amphetamine Sulfate 10 MG Tabs Commonly known as: Evekeo Take 1 tablet (10 mg total) by mouth 2 (two) times daily  with breakfast and lunch.   cetirizine 10 MG tablet Commonly known as: ZYRTEC Take 10 mg by mouth at bedtime.   cyclobenzaprine 5 MG tablet Commonly known as: FLEXERIL Take 1 tablet (5 mg total) by mouth as needed.   docusate sodium 100 MG capsule Commonly known as: COLACE Take 1 capsule (100 mg total) by mouth 2 (two) times daily.   ezetimibe 10 MG tablet Commonly known as: ZETIA Take 10 mg by mouth daily.   gabapentin 300 MG capsule Commonly known as: NEURONTIN Take 300-600 mg by mouth See admin instructions. 300 mg in the morning, 600 mg at bedtime   ibuprofen 600 MG tablet Commonly known as: ADVIL Take 1 tablet (600 mg total) by mouth every 6 (six) hours.   lisinopril-hydrochlorothiazide 10-12.5 MG tablet Commonly known as: ZESTORETIC Take 1 tablet by mouth daily.   metFORMIN 500 MG 24 hr tablet Commonly known as: GLUCOPHAGE-XR Take 2 tablets (1,000 mg total) by mouth daily in the afternoon. What changed:  how much to take when to take this   metoprolol succinate 50 MG 24 hr tablet Commonly known as: TOPROL-XL Take 50 mg by mouth daily.   montelukast 10 MG tablet Commonly known as: SINGULAIR Take 10 mg by mouth daily as needed.   NASACORT ALLERGY 24HR NA Place into the nose.   omega-3 acid ethyl esters 1 g capsule Commonly known as: LOVAZA Take by mouth 2 (two) times daily.   omeprazole 20 MG capsule Commonly known as: PRILOSEC Take 20 mg by mouth daily.   oxyCODONE 5 MG immediate release tablet Commonly known as: Oxy IR/ROXICODONE Take 1 tablet (5 mg total) by mouth every 4 (four) hours as needed for moderate pain.   polyethylene glycol 17 g packet Commonly known as: MIRALAX / GLYCOLAX Take 17 g by mouth daily as needed for mild constipation.   PROBIOTIC DAILY PO Take 1 capsule by mouth at bedtime.   rosuvastatin 10 MG tablet Commonly known as: CRESTOR Take 10 mg by mouth 3 (three) times a week. Monday, Wednesday & Friday   simethicone 80 MG  chewable tablet Commonly known as: MYLICON Chew 1 tablet (80 mg total) by mouth 4 (four) times daily as needed for flatulence.   TURMERIC PO Take 2,250 mg by mouth in the morning and at bedtime.               Discharge Care Instructions  (From admission, onward)           Start     Ordered   03/18/23 0000  No dressing needed  03/18/23 0825            Follow-up Information     Charlett Nose, MD. Go on 03/31/2023.   Specialty: Obstetrics and Gynecology Contact information: [address] [place] [address] [phone]                 Signed: Charlett Nose 03/18/2023, 8:28 AM

## 2023-03-20 ENCOUNTER — Encounter (HOSPITAL_BASED_OUTPATIENT_CLINIC_OR_DEPARTMENT_OTHER): Payer: Self-pay | Admitting: Obstetrics and Gynecology

## 2023-03-20 LAB — SURGICAL PATHOLOGY

## 2023-03-31 ENCOUNTER — Ambulatory Visit: Payer: BC Managed Care – PPO | Admitting: Internal Medicine

## 2023-04-18 ENCOUNTER — Other Ambulatory Visit (HOSPITAL_BASED_OUTPATIENT_CLINIC_OR_DEPARTMENT_OTHER): Payer: Self-pay

## 2023-04-18 MED ORDER — AMPHETAMINE SULFATE 10 MG PO TABS
10.0000 mg | ORAL_TABLET | Freq: Every morning | ORAL | 0 refills | Status: AC
  Filled 2023-04-18: qty 60, 30d supply, fill #0

## 2023-04-28 DIAGNOSIS — S82842D Displaced bimalleolar fracture of left lower leg, subsequent encounter for closed fracture with routine healing: Secondary | ICD-10-CM | POA: Diagnosis not present

## 2023-05-09 DIAGNOSIS — R7301 Impaired fasting glucose: Secondary | ICD-10-CM | POA: Diagnosis not present

## 2023-05-09 DIAGNOSIS — I10 Essential (primary) hypertension: Secondary | ICD-10-CM | POA: Diagnosis not present

## 2023-05-09 DIAGNOSIS — Z23 Encounter for immunization: Secondary | ICD-10-CM | POA: Diagnosis not present

## 2023-05-09 DIAGNOSIS — E782 Mixed hyperlipidemia: Secondary | ICD-10-CM | POA: Diagnosis not present

## 2023-05-18 DIAGNOSIS — M948X7 Other specified disorders of cartilage, ankle and foot: Secondary | ICD-10-CM | POA: Diagnosis not present

## 2023-05-18 DIAGNOSIS — M89362 Hypertrophy of bone, left tibia: Secondary | ICD-10-CM | POA: Diagnosis not present

## 2023-05-18 DIAGNOSIS — T8484XA Pain due to internal orthopedic prosthetic devices, implants and grafts, initial encounter: Secondary | ICD-10-CM | POA: Diagnosis not present

## 2023-05-18 DIAGNOSIS — M25872 Other specified joint disorders, left ankle and foot: Secondary | ICD-10-CM | POA: Diagnosis not present

## 2023-05-18 DIAGNOSIS — M19072 Primary osteoarthritis, left ankle and foot: Secondary | ICD-10-CM | POA: Diagnosis not present

## 2023-05-18 DIAGNOSIS — M24072 Loose body in left ankle: Secondary | ICD-10-CM | POA: Diagnosis not present

## 2023-05-18 DIAGNOSIS — M89372 Hypertrophy of bone, left ankle and foot: Secondary | ICD-10-CM | POA: Diagnosis not present

## 2023-05-18 DIAGNOSIS — G8918 Other acute postprocedural pain: Secondary | ICD-10-CM | POA: Diagnosis not present

## 2023-05-18 DIAGNOSIS — M65872 Other synovitis and tenosynovitis, left ankle and foot: Secondary | ICD-10-CM | POA: Diagnosis not present

## 2023-05-31 DIAGNOSIS — S82842D Displaced bimalleolar fracture of left lower leg, subsequent encounter for closed fracture with routine healing: Secondary | ICD-10-CM | POA: Diagnosis not present

## 2023-06-01 ENCOUNTER — Other Ambulatory Visit (HOSPITAL_BASED_OUTPATIENT_CLINIC_OR_DEPARTMENT_OTHER): Payer: Self-pay

## 2023-06-02 ENCOUNTER — Other Ambulatory Visit (HOSPITAL_BASED_OUTPATIENT_CLINIC_OR_DEPARTMENT_OTHER): Payer: Self-pay

## 2023-06-02 MED ORDER — AMPHETAMINE SULFATE 10 MG PO TABS
10.0000 mg | ORAL_TABLET | Freq: Two times a day (BID) | ORAL | 0 refills | Status: AC
  Filled 2023-07-27: qty 60, 30d supply, fill #0

## 2023-06-02 MED ORDER — AMPHETAMINE SULFATE 10 MG PO TABS
10.0000 mg | ORAL_TABLET | Freq: Two times a day (BID) | ORAL | 0 refills | Status: AC | PRN
  Filled 2023-06-02: qty 60, 30d supply, fill #0

## 2023-07-27 ENCOUNTER — Other Ambulatory Visit: Payer: Self-pay

## 2023-07-27 ENCOUNTER — Other Ambulatory Visit (HOSPITAL_BASED_OUTPATIENT_CLINIC_OR_DEPARTMENT_OTHER): Payer: Self-pay

## 2023-07-31 ENCOUNTER — Other Ambulatory Visit (HOSPITAL_BASED_OUTPATIENT_CLINIC_OR_DEPARTMENT_OTHER): Payer: Self-pay

## 2023-09-05 ENCOUNTER — Other Ambulatory Visit (HOSPITAL_BASED_OUTPATIENT_CLINIC_OR_DEPARTMENT_OTHER): Payer: Self-pay

## 2023-09-05 MED ORDER — AMPHETAMINE SULFATE 10 MG PO TABS
10.0000 mg | ORAL_TABLET | Freq: Two times a day (BID) | ORAL | 0 refills | Status: AC
  Filled 2023-09-05: qty 60, 30d supply, fill #0

## 2023-09-05 MED ORDER — AMPHETAMINE SULFATE 10 MG PO TABS
10.0000 mg | ORAL_TABLET | Freq: Two times a day (BID) | ORAL | 0 refills | Status: AC
  Filled 2023-10-04: qty 60, 30d supply, fill #0

## 2023-10-02 ENCOUNTER — Other Ambulatory Visit (HOSPITAL_BASED_OUTPATIENT_CLINIC_OR_DEPARTMENT_OTHER): Payer: Self-pay

## 2023-10-04 ENCOUNTER — Other Ambulatory Visit (HOSPITAL_BASED_OUTPATIENT_CLINIC_OR_DEPARTMENT_OTHER): Payer: Self-pay

## 2023-10-05 DIAGNOSIS — R339 Retention of urine, unspecified: Secondary | ICD-10-CM | POA: Diagnosis not present

## 2023-10-05 DIAGNOSIS — R300 Dysuria: Secondary | ICD-10-CM | POA: Diagnosis not present

## 2023-10-05 DIAGNOSIS — M71349 Other bursal cyst, unspecified hand: Secondary | ICD-10-CM | POA: Diagnosis not present

## 2023-10-18 DIAGNOSIS — G4733 Obstructive sleep apnea (adult) (pediatric): Secondary | ICD-10-CM | POA: Diagnosis not present

## 2023-10-19 DIAGNOSIS — H18603 Keratoconus, unspecified, bilateral: Secondary | ICD-10-CM | POA: Diagnosis not present

## 2023-10-19 DIAGNOSIS — H52203 Unspecified astigmatism, bilateral: Secondary | ICD-10-CM | POA: Diagnosis not present

## 2023-10-19 DIAGNOSIS — H5213 Myopia, bilateral: Secondary | ICD-10-CM | POA: Diagnosis not present

## 2023-11-07 ENCOUNTER — Other Ambulatory Visit (HOSPITAL_BASED_OUTPATIENT_CLINIC_OR_DEPARTMENT_OTHER): Payer: Self-pay

## 2023-11-07 DIAGNOSIS — I10 Essential (primary) hypertension: Secondary | ICD-10-CM | POA: Diagnosis not present

## 2023-11-07 DIAGNOSIS — E782 Mixed hyperlipidemia: Secondary | ICD-10-CM | POA: Diagnosis not present

## 2023-11-07 DIAGNOSIS — J309 Allergic rhinitis, unspecified: Secondary | ICD-10-CM | POA: Diagnosis not present

## 2023-11-07 DIAGNOSIS — F900 Attention-deficit hyperactivity disorder, predominantly inattentive type: Secondary | ICD-10-CM | POA: Diagnosis not present

## 2023-11-07 DIAGNOSIS — R7303 Prediabetes: Secondary | ICD-10-CM | POA: Diagnosis not present

## 2023-11-07 MED ORDER — AMPHETAMINE SULFATE 10 MG PO TABS
10.0000 mg | ORAL_TABLET | Freq: Two times a day (BID) | ORAL | 0 refills | Status: AC | PRN
  Filled 2023-12-22: qty 60, 30d supply, fill #0

## 2023-11-07 MED ORDER — AMPHETAMINE SULFATE 10 MG PO TABS
10.0000 mg | ORAL_TABLET | Freq: Two times a day (BID) | ORAL | 0 refills | Status: AC | PRN
  Filled 2023-11-07: qty 60, 30d supply, fill #0

## 2023-11-07 MED ORDER — AMPHETAMINE SULFATE 10 MG PO TABS
10.0000 mg | ORAL_TABLET | Freq: Two times a day (BID) | ORAL | 0 refills | Status: DC | PRN
  Filled 2024-02-07: qty 60, 30d supply, fill #0

## 2023-12-22 ENCOUNTER — Other Ambulatory Visit (HOSPITAL_BASED_OUTPATIENT_CLINIC_OR_DEPARTMENT_OTHER): Payer: Self-pay

## 2023-12-22 ENCOUNTER — Other Ambulatory Visit: Payer: Self-pay

## 2023-12-28 DIAGNOSIS — Z01419 Encounter for gynecological examination (general) (routine) without abnormal findings: Secondary | ICD-10-CM | POA: Diagnosis not present

## 2023-12-28 DIAGNOSIS — Z1231 Encounter for screening mammogram for malignant neoplasm of breast: Secondary | ICD-10-CM | POA: Diagnosis not present

## 2024-01-29 DIAGNOSIS — H18623 Keratoconus, unstable, bilateral: Secondary | ICD-10-CM | POA: Diagnosis not present

## 2024-01-29 DIAGNOSIS — H2513 Age-related nuclear cataract, bilateral: Secondary | ICD-10-CM | POA: Diagnosis not present

## 2024-02-07 ENCOUNTER — Other Ambulatory Visit (HOSPITAL_BASED_OUTPATIENT_CLINIC_OR_DEPARTMENT_OTHER): Payer: Self-pay

## 2024-02-07 ENCOUNTER — Other Ambulatory Visit: Payer: Self-pay

## 2024-02-16 ENCOUNTER — Other Ambulatory Visit (HOSPITAL_BASED_OUTPATIENT_CLINIC_OR_DEPARTMENT_OTHER): Payer: Self-pay

## 2024-03-21 ENCOUNTER — Other Ambulatory Visit (HOSPITAL_BASED_OUTPATIENT_CLINIC_OR_DEPARTMENT_OTHER): Payer: Self-pay

## 2024-03-21 MED ORDER — AMPHETAMINE SULFATE 10 MG PO TABS
10.0000 mg | ORAL_TABLET | Freq: Two times a day (BID) | ORAL | 0 refills | Status: AC | PRN
  Filled 2024-03-21: qty 60, 30d supply, fill #0

## 2024-03-21 MED ORDER — AMPHETAMINE SULFATE 10 MG PO TABS
10.0000 mg | ORAL_TABLET | Freq: Two times a day (BID) | ORAL | 0 refills | Status: AC
  Filled 2024-04-22: qty 60, 30d supply, fill #0

## 2024-04-18 ENCOUNTER — Other Ambulatory Visit (HOSPITAL_BASED_OUTPATIENT_CLINIC_OR_DEPARTMENT_OTHER): Payer: Self-pay

## 2024-04-22 ENCOUNTER — Other Ambulatory Visit (HOSPITAL_BASED_OUTPATIENT_CLINIC_OR_DEPARTMENT_OTHER): Payer: Self-pay

## 2024-05-06 DIAGNOSIS — Z1331 Encounter for screening for depression: Secondary | ICD-10-CM | POA: Diagnosis not present

## 2024-05-06 DIAGNOSIS — Z7182 Exercise counseling: Secondary | ICD-10-CM | POA: Diagnosis not present

## 2024-05-06 DIAGNOSIS — Z6838 Body mass index (BMI) 38.0-38.9, adult: Secondary | ICD-10-CM | POA: Diagnosis not present

## 2024-05-06 DIAGNOSIS — R7303 Prediabetes: Secondary | ICD-10-CM | POA: Diagnosis not present

## 2024-05-06 DIAGNOSIS — E66812 Obesity, class 2: Secondary | ICD-10-CM | POA: Diagnosis not present

## 2024-05-06 DIAGNOSIS — E782 Mixed hyperlipidemia: Secondary | ICD-10-CM | POA: Diagnosis not present

## 2024-05-06 DIAGNOSIS — Z713 Dietary counseling and surveillance: Secondary | ICD-10-CM | POA: Diagnosis not present

## 2024-05-15 ENCOUNTER — Other Ambulatory Visit (HOSPITAL_BASED_OUTPATIENT_CLINIC_OR_DEPARTMENT_OTHER): Payer: Self-pay

## 2024-05-15 DIAGNOSIS — F900 Attention-deficit hyperactivity disorder, predominantly inattentive type: Secondary | ICD-10-CM | POA: Diagnosis not present

## 2024-05-15 DIAGNOSIS — Z23 Encounter for immunization: Secondary | ICD-10-CM | POA: Diagnosis not present

## 2024-05-15 DIAGNOSIS — J309 Allergic rhinitis, unspecified: Secondary | ICD-10-CM | POA: Diagnosis not present

## 2024-05-15 DIAGNOSIS — I10 Essential (primary) hypertension: Secondary | ICD-10-CM | POA: Diagnosis not present

## 2024-05-15 DIAGNOSIS — E782 Mixed hyperlipidemia: Secondary | ICD-10-CM | POA: Diagnosis not present

## 2024-05-15 MED ORDER — AMPHETAMINE SULFATE 10 MG PO TABS
10.0000 mg | ORAL_TABLET | Freq: Two times a day (BID) | ORAL | 0 refills | Status: AC
  Filled 2024-08-02: qty 60, 30d supply, fill #0
  Filled 2024-08-02: qty 10, 5d supply, fill #0
  Filled 2024-08-07: qty 60, 30d supply, fill #0

## 2024-05-15 MED ORDER — AMPHETAMINE SULFATE 10 MG PO TABS
10.0000 mg | ORAL_TABLET | Freq: Two times a day (BID) | ORAL | 0 refills | Status: AC
  Filled 2024-06-03: qty 60, 30d supply, fill #0

## 2024-05-15 MED ORDER — AMPHETAMINE SULFATE 10 MG PO TABS
10.0000 mg | ORAL_TABLET | Freq: Two times a day (BID) | ORAL | 0 refills | Status: AC
  Filled 2024-07-09: qty 60, 30d supply, fill #0

## 2024-05-21 DIAGNOSIS — Z7182 Exercise counseling: Secondary | ICD-10-CM | POA: Diagnosis not present

## 2024-05-21 DIAGNOSIS — Z6837 Body mass index (BMI) 37.0-37.9, adult: Secondary | ICD-10-CM | POA: Diagnosis not present

## 2024-05-21 DIAGNOSIS — E66812 Obesity, class 2: Secondary | ICD-10-CM | POA: Diagnosis not present

## 2024-05-21 DIAGNOSIS — Z713 Dietary counseling and surveillance: Secondary | ICD-10-CM | POA: Diagnosis not present

## 2024-06-03 ENCOUNTER — Other Ambulatory Visit (HOSPITAL_BASED_OUTPATIENT_CLINIC_OR_DEPARTMENT_OTHER): Payer: Self-pay

## 2024-06-04 DIAGNOSIS — R748 Abnormal levels of other serum enzymes: Secondary | ICD-10-CM | POA: Diagnosis not present

## 2024-06-04 DIAGNOSIS — Z6836 Body mass index (BMI) 36.0-36.9, adult: Secondary | ICD-10-CM | POA: Diagnosis not present

## 2024-06-04 DIAGNOSIS — Z713 Dietary counseling and surveillance: Secondary | ICD-10-CM | POA: Diagnosis not present

## 2024-06-04 DIAGNOSIS — K760 Fatty (change of) liver, not elsewhere classified: Secondary | ICD-10-CM | POA: Diagnosis not present

## 2024-06-04 DIAGNOSIS — E66812 Obesity, class 2: Secondary | ICD-10-CM | POA: Diagnosis not present

## 2024-06-13 ENCOUNTER — Other Ambulatory Visit: Payer: Self-pay | Admitting: Physician Assistant

## 2024-06-13 DIAGNOSIS — R112 Nausea with vomiting, unspecified: Secondary | ICD-10-CM | POA: Diagnosis not present

## 2024-06-13 DIAGNOSIS — N390 Urinary tract infection, site not specified: Secondary | ICD-10-CM | POA: Diagnosis not present

## 2024-06-13 DIAGNOSIS — R350 Frequency of micturition: Secondary | ICD-10-CM | POA: Diagnosis not present

## 2024-06-13 DIAGNOSIS — R110 Nausea: Secondary | ICD-10-CM | POA: Diagnosis not present

## 2024-06-13 DIAGNOSIS — R109 Unspecified abdominal pain: Secondary | ICD-10-CM | POA: Diagnosis not present

## 2024-06-14 DIAGNOSIS — R300 Dysuria: Secondary | ICD-10-CM | POA: Diagnosis not present

## 2024-06-20 ENCOUNTER — Inpatient Hospital Stay
Admission: RE | Admit: 2024-06-20 | Discharge: 2024-06-20 | Attending: Physician Assistant | Admitting: Physician Assistant

## 2024-06-20 DIAGNOSIS — K760 Fatty (change of) liver, not elsewhere classified: Secondary | ICD-10-CM | POA: Diagnosis not present

## 2024-06-20 DIAGNOSIS — R112 Nausea with vomiting, unspecified: Secondary | ICD-10-CM

## 2024-07-02 DIAGNOSIS — K760 Fatty (change of) liver, not elsewhere classified: Secondary | ICD-10-CM | POA: Diagnosis not present

## 2024-07-02 DIAGNOSIS — Z6836 Body mass index (BMI) 36.0-36.9, adult: Secondary | ICD-10-CM | POA: Diagnosis not present

## 2024-07-02 DIAGNOSIS — E66812 Obesity, class 2: Secondary | ICD-10-CM | POA: Diagnosis not present

## 2024-07-02 DIAGNOSIS — R748 Abnormal levels of other serum enzymes: Secondary | ICD-10-CM | POA: Diagnosis not present

## 2024-07-09 ENCOUNTER — Other Ambulatory Visit (HOSPITAL_BASED_OUTPATIENT_CLINIC_OR_DEPARTMENT_OTHER): Payer: Self-pay

## 2024-07-10 DIAGNOSIS — K760 Fatty (change of) liver, not elsewhere classified: Secondary | ICD-10-CM | POA: Diagnosis not present

## 2024-07-10 DIAGNOSIS — R112 Nausea with vomiting, unspecified: Secondary | ICD-10-CM | POA: Diagnosis not present

## 2024-08-02 ENCOUNTER — Other Ambulatory Visit (HOSPITAL_BASED_OUTPATIENT_CLINIC_OR_DEPARTMENT_OTHER): Payer: Self-pay

## 2024-08-06 ENCOUNTER — Other Ambulatory Visit (HOSPITAL_BASED_OUTPATIENT_CLINIC_OR_DEPARTMENT_OTHER): Payer: Self-pay

## 2024-08-07 ENCOUNTER — Other Ambulatory Visit (HOSPITAL_BASED_OUTPATIENT_CLINIC_OR_DEPARTMENT_OTHER): Payer: Self-pay
